# Patient Record
Sex: Male | Born: 1945 | Race: White | Hispanic: No | Smoking: Never smoker
Health system: Southern US, Community
[De-identification: ages and names within clinical notes are randomized; demographics above are authoritative.]

## PROBLEM LIST (undated history)

## (undated) DIAGNOSIS — R7303 Prediabetes: Secondary | ICD-10-CM

## (undated) DIAGNOSIS — I10 Essential (primary) hypertension: Secondary | ICD-10-CM

## (undated) DIAGNOSIS — M1990 Unspecified osteoarthritis, unspecified site: Secondary | ICD-10-CM

## (undated) DIAGNOSIS — Z87442 Personal history of urinary calculi: Secondary | ICD-10-CM

## (undated) HISTORY — PX: COLONOSCOPY: SHX174

## (undated) HISTORY — PX: CARPAL TUNNEL RELEASE: SHX101

## (undated) HISTORY — PX: JOINT REPLACEMENT: SHX530

## (undated) HISTORY — PX: BACK SURGERY: SHX140

## (undated) HISTORY — PX: HERNIA REPAIR: SHX51

## (undated) HISTORY — PX: WISDOM TOOTH EXTRACTION: SHX21

---

## 2015-11-02 DIAGNOSIS — Z23 Encounter for immunization: Secondary | ICD-10-CM | POA: Diagnosis not present

## 2015-11-02 DIAGNOSIS — Z0000 Encounter for general adult medical examination without abnormal findings: Secondary | ICD-10-CM | POA: Diagnosis not present

## 2015-11-02 DIAGNOSIS — D485 Neoplasm of uncertain behavior of skin: Secondary | ICD-10-CM | POA: Diagnosis not present

## 2015-11-02 DIAGNOSIS — Z6831 Body mass index (BMI) 31.0-31.9, adult: Secondary | ICD-10-CM | POA: Diagnosis not present

## 2015-11-02 DIAGNOSIS — M109 Gout, unspecified: Secondary | ICD-10-CM | POA: Diagnosis not present

## 2015-11-02 DIAGNOSIS — Z79899 Other long term (current) drug therapy: Secondary | ICD-10-CM | POA: Diagnosis not present

## 2015-11-02 DIAGNOSIS — E119 Type 2 diabetes mellitus without complications: Secondary | ICD-10-CM | POA: Diagnosis not present

## 2015-11-02 DIAGNOSIS — I10 Essential (primary) hypertension: Secondary | ICD-10-CM | POA: Diagnosis not present

## 2015-11-02 DIAGNOSIS — E785 Hyperlipidemia, unspecified: Secondary | ICD-10-CM | POA: Diagnosis not present

## 2015-11-02 DIAGNOSIS — Z125 Encounter for screening for malignant neoplasm of prostate: Secondary | ICD-10-CM | POA: Diagnosis not present

## 2015-11-02 DIAGNOSIS — E669 Obesity, unspecified: Secondary | ICD-10-CM | POA: Diagnosis not present

## 2015-11-02 DIAGNOSIS — M1990 Unspecified osteoarthritis, unspecified site: Secondary | ICD-10-CM | POA: Diagnosis not present

## 2015-12-29 DIAGNOSIS — E119 Type 2 diabetes mellitus without complications: Secondary | ICD-10-CM | POA: Diagnosis not present

## 2015-12-29 DIAGNOSIS — H2513 Age-related nuclear cataract, bilateral: Secondary | ICD-10-CM | POA: Diagnosis not present

## 2015-12-29 DIAGNOSIS — H524 Presbyopia: Secondary | ICD-10-CM | POA: Diagnosis not present

## 2016-02-26 DIAGNOSIS — L570 Actinic keratosis: Secondary | ICD-10-CM | POA: Diagnosis not present

## 2016-02-26 DIAGNOSIS — D485 Neoplasm of uncertain behavior of skin: Secondary | ICD-10-CM | POA: Diagnosis not present

## 2016-02-26 DIAGNOSIS — D225 Melanocytic nevi of trunk: Secondary | ICD-10-CM | POA: Diagnosis not present

## 2016-02-26 DIAGNOSIS — D2239 Melanocytic nevi of other parts of face: Secondary | ICD-10-CM | POA: Diagnosis not present

## 2016-03-21 DIAGNOSIS — C44612 Basal cell carcinoma of skin of right upper limb, including shoulder: Secondary | ICD-10-CM | POA: Diagnosis not present

## 2016-07-11 DIAGNOSIS — M109 Gout, unspecified: Secondary | ICD-10-CM | POA: Diagnosis not present

## 2016-07-11 DIAGNOSIS — Z23 Encounter for immunization: Secondary | ICD-10-CM | POA: Diagnosis not present

## 2016-07-11 DIAGNOSIS — I10 Essential (primary) hypertension: Secondary | ICD-10-CM | POA: Diagnosis not present

## 2016-07-11 DIAGNOSIS — Z79899 Other long term (current) drug therapy: Secondary | ICD-10-CM | POA: Diagnosis not present

## 2016-07-11 DIAGNOSIS — E6609 Other obesity due to excess calories: Secondary | ICD-10-CM | POA: Diagnosis not present

## 2016-07-11 DIAGNOSIS — Z6831 Body mass index (BMI) 31.0-31.9, adult: Secondary | ICD-10-CM | POA: Diagnosis not present

## 2016-07-11 DIAGNOSIS — E119 Type 2 diabetes mellitus without complications: Secondary | ICD-10-CM | POA: Diagnosis not present

## 2016-07-11 DIAGNOSIS — E782 Mixed hyperlipidemia: Secondary | ICD-10-CM | POA: Diagnosis not present

## 2016-07-11 DIAGNOSIS — Z0000 Encounter for general adult medical examination without abnormal findings: Secondary | ICD-10-CM | POA: Diagnosis not present

## 2016-08-22 DIAGNOSIS — R748 Abnormal levels of other serum enzymes: Secondary | ICD-10-CM | POA: Diagnosis not present

## 2016-08-22 DIAGNOSIS — Z6830 Body mass index (BMI) 30.0-30.9, adult: Secondary | ICD-10-CM | POA: Diagnosis not present

## 2016-08-22 DIAGNOSIS — E6609 Other obesity due to excess calories: Secondary | ICD-10-CM | POA: Diagnosis not present

## 2016-08-22 DIAGNOSIS — E782 Mixed hyperlipidemia: Secondary | ICD-10-CM | POA: Diagnosis not present

## 2016-08-22 DIAGNOSIS — E119 Type 2 diabetes mellitus without complications: Secondary | ICD-10-CM | POA: Diagnosis not present

## 2016-08-28 DIAGNOSIS — C44212 Basal cell carcinoma of skin of right ear and external auricular canal: Secondary | ICD-10-CM | POA: Diagnosis not present

## 2016-08-28 DIAGNOSIS — D225 Melanocytic nevi of trunk: Secondary | ICD-10-CM | POA: Diagnosis not present

## 2016-08-28 DIAGNOSIS — D2239 Melanocytic nevi of other parts of face: Secondary | ICD-10-CM | POA: Diagnosis not present

## 2016-08-28 DIAGNOSIS — L821 Other seborrheic keratosis: Secondary | ICD-10-CM | POA: Diagnosis not present

## 2016-08-28 DIAGNOSIS — C44519 Basal cell carcinoma of skin of other part of trunk: Secondary | ICD-10-CM | POA: Diagnosis not present

## 2016-08-28 DIAGNOSIS — L820 Inflamed seborrheic keratosis: Secondary | ICD-10-CM | POA: Diagnosis not present

## 2016-09-12 DIAGNOSIS — C44519 Basal cell carcinoma of skin of other part of trunk: Secondary | ICD-10-CM | POA: Diagnosis not present

## 2016-12-30 DIAGNOSIS — R7303 Prediabetes: Secondary | ICD-10-CM | POA: Diagnosis not present

## 2016-12-30 DIAGNOSIS — H2513 Age-related nuclear cataract, bilateral: Secondary | ICD-10-CM | POA: Diagnosis not present

## 2017-02-06 DIAGNOSIS — I10 Essential (primary) hypertension: Secondary | ICD-10-CM | POA: Diagnosis not present

## 2017-02-06 DIAGNOSIS — E782 Mixed hyperlipidemia: Secondary | ICD-10-CM | POA: Diagnosis not present

## 2017-02-06 DIAGNOSIS — R748 Abnormal levels of other serum enzymes: Secondary | ICD-10-CM | POA: Diagnosis not present

## 2017-02-06 DIAGNOSIS — Z79899 Other long term (current) drug therapy: Secondary | ICD-10-CM | POA: Diagnosis not present

## 2017-02-06 DIAGNOSIS — E119 Type 2 diabetes mellitus without complications: Secondary | ICD-10-CM | POA: Diagnosis not present

## 2017-02-06 DIAGNOSIS — E6609 Other obesity due to excess calories: Secondary | ICD-10-CM | POA: Diagnosis not present

## 2017-02-06 DIAGNOSIS — Z6831 Body mass index (BMI) 31.0-31.9, adult: Secondary | ICD-10-CM | POA: Diagnosis not present

## 2017-02-06 DIAGNOSIS — M150 Primary generalized (osteo)arthritis: Secondary | ICD-10-CM | POA: Diagnosis not present

## 2017-04-14 DIAGNOSIS — M7582 Other shoulder lesions, left shoulder: Secondary | ICD-10-CM | POA: Diagnosis not present

## 2017-05-02 DIAGNOSIS — D225 Melanocytic nevi of trunk: Secondary | ICD-10-CM | POA: Diagnosis not present

## 2017-05-02 DIAGNOSIS — L814 Other melanin hyperpigmentation: Secondary | ICD-10-CM | POA: Diagnosis not present

## 2017-05-02 DIAGNOSIS — D485 Neoplasm of uncertain behavior of skin: Secondary | ICD-10-CM | POA: Diagnosis not present

## 2017-05-02 DIAGNOSIS — D2239 Melanocytic nevi of other parts of face: Secondary | ICD-10-CM | POA: Diagnosis not present

## 2017-05-02 DIAGNOSIS — L570 Actinic keratosis: Secondary | ICD-10-CM | POA: Diagnosis not present

## 2017-10-30 DIAGNOSIS — D225 Melanocytic nevi of trunk: Secondary | ICD-10-CM | POA: Diagnosis not present

## 2017-10-30 DIAGNOSIS — L570 Actinic keratosis: Secondary | ICD-10-CM | POA: Diagnosis not present

## 2017-10-30 DIAGNOSIS — D485 Neoplasm of uncertain behavior of skin: Secondary | ICD-10-CM | POA: Diagnosis not present

## 2017-10-30 DIAGNOSIS — L814 Other melanin hyperpigmentation: Secondary | ICD-10-CM | POA: Diagnosis not present

## 2017-10-30 DIAGNOSIS — D2239 Melanocytic nevi of other parts of face: Secondary | ICD-10-CM | POA: Diagnosis not present

## 2017-10-30 DIAGNOSIS — L821 Other seborrheic keratosis: Secondary | ICD-10-CM | POA: Diagnosis not present

## 2017-11-18 DIAGNOSIS — R202 Paresthesia of skin: Secondary | ICD-10-CM | POA: Diagnosis not present

## 2017-11-18 DIAGNOSIS — E119 Type 2 diabetes mellitus without complications: Secondary | ICD-10-CM | POA: Diagnosis not present

## 2017-11-18 DIAGNOSIS — R5381 Other malaise: Secondary | ICD-10-CM | POA: Diagnosis not present

## 2017-11-18 DIAGNOSIS — R5383 Other fatigue: Secondary | ICD-10-CM | POA: Diagnosis not present

## 2017-11-18 DIAGNOSIS — R200 Anesthesia of skin: Secondary | ICD-10-CM | POA: Diagnosis not present

## 2017-11-18 DIAGNOSIS — I10 Essential (primary) hypertension: Secondary | ICD-10-CM | POA: Diagnosis not present

## 2017-11-24 DIAGNOSIS — G5601 Carpal tunnel syndrome, right upper limb: Secondary | ICD-10-CM | POA: Diagnosis not present

## 2017-11-26 DIAGNOSIS — G629 Polyneuropathy, unspecified: Secondary | ICD-10-CM | POA: Diagnosis not present

## 2017-12-01 DIAGNOSIS — E1142 Type 2 diabetes mellitus with diabetic polyneuropathy: Secondary | ICD-10-CM | POA: Diagnosis not present

## 2017-12-01 DIAGNOSIS — G5603 Carpal tunnel syndrome, bilateral upper limbs: Secondary | ICD-10-CM | POA: Diagnosis not present

## 2017-12-05 DIAGNOSIS — E119 Type 2 diabetes mellitus without complications: Secondary | ICD-10-CM | POA: Diagnosis not present

## 2017-12-05 DIAGNOSIS — G5601 Carpal tunnel syndrome, right upper limb: Secondary | ICD-10-CM | POA: Diagnosis not present

## 2017-12-05 DIAGNOSIS — Z01818 Encounter for other preprocedural examination: Secondary | ICD-10-CM | POA: Diagnosis not present

## 2017-12-05 DIAGNOSIS — M150 Primary generalized (osteo)arthritis: Secondary | ICD-10-CM | POA: Diagnosis not present

## 2017-12-05 DIAGNOSIS — M109 Gout, unspecified: Secondary | ICD-10-CM | POA: Diagnosis not present

## 2017-12-22 DIAGNOSIS — G5601 Carpal tunnel syndrome, right upper limb: Secondary | ICD-10-CM | POA: Diagnosis not present

## 2017-12-22 DIAGNOSIS — M19041 Primary osteoarthritis, right hand: Secondary | ICD-10-CM | POA: Diagnosis not present

## 2017-12-25 DIAGNOSIS — Z79899 Other long term (current) drug therapy: Secondary | ICD-10-CM | POA: Diagnosis not present

## 2017-12-25 DIAGNOSIS — M1990 Unspecified osteoarthritis, unspecified site: Secondary | ICD-10-CM | POA: Diagnosis not present

## 2017-12-25 DIAGNOSIS — M109 Gout, unspecified: Secondary | ICD-10-CM | POA: Diagnosis not present

## 2017-12-25 DIAGNOSIS — Z7984 Long term (current) use of oral hypoglycemic drugs: Secondary | ICD-10-CM | POA: Diagnosis not present

## 2017-12-25 DIAGNOSIS — N400 Enlarged prostate without lower urinary tract symptoms: Secondary | ICD-10-CM | POA: Diagnosis not present

## 2017-12-25 DIAGNOSIS — Z791 Long term (current) use of non-steroidal anti-inflammatories (NSAID): Secondary | ICD-10-CM | POA: Diagnosis not present

## 2017-12-25 DIAGNOSIS — I10 Essential (primary) hypertension: Secondary | ICD-10-CM | POA: Diagnosis not present

## 2017-12-25 DIAGNOSIS — E7800 Pure hypercholesterolemia, unspecified: Secondary | ICD-10-CM | POA: Diagnosis not present

## 2017-12-25 DIAGNOSIS — M19041 Primary osteoarthritis, right hand: Secondary | ICD-10-CM | POA: Diagnosis not present

## 2017-12-25 DIAGNOSIS — E1142 Type 2 diabetes mellitus with diabetic polyneuropathy: Secondary | ICD-10-CM | POA: Diagnosis not present

## 2017-12-25 DIAGNOSIS — G5601 Carpal tunnel syndrome, right upper limb: Secondary | ICD-10-CM | POA: Diagnosis not present

## 2017-12-25 DIAGNOSIS — Z7982 Long term (current) use of aspirin: Secondary | ICD-10-CM | POA: Diagnosis not present

## 2017-12-29 DIAGNOSIS — Z4789 Encounter for other orthopedic aftercare: Secondary | ICD-10-CM | POA: Diagnosis not present

## 2017-12-29 DIAGNOSIS — M25531 Pain in right wrist: Secondary | ICD-10-CM | POA: Diagnosis not present

## 2017-12-31 DIAGNOSIS — E119 Type 2 diabetes mellitus without complications: Secondary | ICD-10-CM | POA: Diagnosis not present

## 2017-12-31 DIAGNOSIS — H2513 Age-related nuclear cataract, bilateral: Secondary | ICD-10-CM | POA: Diagnosis not present

## 2018-01-20 DIAGNOSIS — M25531 Pain in right wrist: Secondary | ICD-10-CM | POA: Diagnosis not present

## 2018-01-20 DIAGNOSIS — Z4789 Encounter for other orthopedic aftercare: Secondary | ICD-10-CM | POA: Diagnosis not present

## 2018-01-21 DIAGNOSIS — G629 Polyneuropathy, unspecified: Secondary | ICD-10-CM | POA: Diagnosis not present

## 2018-01-21 DIAGNOSIS — E119 Type 2 diabetes mellitus without complications: Secondary | ICD-10-CM | POA: Diagnosis not present

## 2018-01-21 DIAGNOSIS — Z0000 Encounter for general adult medical examination without abnormal findings: Secondary | ICD-10-CM | POA: Diagnosis not present

## 2018-01-21 DIAGNOSIS — I10 Essential (primary) hypertension: Secondary | ICD-10-CM | POA: Diagnosis not present

## 2018-01-21 DIAGNOSIS — M109 Gout, unspecified: Secondary | ICD-10-CM | POA: Diagnosis not present

## 2018-02-06 DIAGNOSIS — E119 Type 2 diabetes mellitus without complications: Secondary | ICD-10-CM | POA: Diagnosis not present

## 2018-02-06 DIAGNOSIS — E782 Mixed hyperlipidemia: Secondary | ICD-10-CM | POA: Diagnosis not present

## 2018-02-06 DIAGNOSIS — Z125 Encounter for screening for malignant neoplasm of prostate: Secondary | ICD-10-CM | POA: Diagnosis not present

## 2018-02-20 DIAGNOSIS — Z8042 Family history of malignant neoplasm of prostate: Secondary | ICD-10-CM | POA: Diagnosis not present

## 2018-02-20 DIAGNOSIS — R972 Elevated prostate specific antigen [PSA]: Secondary | ICD-10-CM | POA: Diagnosis not present

## 2018-02-20 DIAGNOSIS — N401 Enlarged prostate with lower urinary tract symptoms: Secondary | ICD-10-CM | POA: Diagnosis not present

## 2018-03-03 DIAGNOSIS — D485 Neoplasm of uncertain behavior of skin: Secondary | ICD-10-CM | POA: Diagnosis not present

## 2018-03-19 DIAGNOSIS — R972 Elevated prostate specific antigen [PSA]: Secondary | ICD-10-CM | POA: Diagnosis not present

## 2018-03-19 DIAGNOSIS — R3129 Other microscopic hematuria: Secondary | ICD-10-CM | POA: Diagnosis not present

## 2018-03-21 DIAGNOSIS — R109 Unspecified abdominal pain: Secondary | ICD-10-CM | POA: Diagnosis not present

## 2018-03-21 DIAGNOSIS — N201 Calculus of ureter: Secondary | ICD-10-CM | POA: Diagnosis not present

## 2018-03-22 DIAGNOSIS — R109 Unspecified abdominal pain: Secondary | ICD-10-CM | POA: Diagnosis not present

## 2018-03-24 DIAGNOSIS — M25531 Pain in right wrist: Secondary | ICD-10-CM | POA: Diagnosis not present

## 2018-04-14 DIAGNOSIS — R3129 Other microscopic hematuria: Secondary | ICD-10-CM | POA: Diagnosis not present

## 2018-04-14 DIAGNOSIS — N401 Enlarged prostate with lower urinary tract symptoms: Secondary | ICD-10-CM | POA: Diagnosis not present

## 2018-04-14 DIAGNOSIS — R972 Elevated prostate specific antigen [PSA]: Secondary | ICD-10-CM | POA: Diagnosis not present

## 2018-04-15 DIAGNOSIS — R972 Elevated prostate specific antigen [PSA]: Secondary | ICD-10-CM | POA: Diagnosis not present

## 2018-04-15 DIAGNOSIS — N401 Enlarged prostate with lower urinary tract symptoms: Secondary | ICD-10-CM | POA: Diagnosis not present

## 2018-04-21 DIAGNOSIS — M5412 Radiculopathy, cervical region: Secondary | ICD-10-CM | POA: Diagnosis not present

## 2018-04-22 DIAGNOSIS — N401 Enlarged prostate with lower urinary tract symptoms: Secondary | ICD-10-CM | POA: Diagnosis not present

## 2018-04-22 DIAGNOSIS — R972 Elevated prostate specific antigen [PSA]: Secondary | ICD-10-CM | POA: Diagnosis not present

## 2018-05-07 DIAGNOSIS — M4803 Spinal stenosis, cervicothoracic region: Secondary | ICD-10-CM | POA: Diagnosis not present

## 2018-05-07 DIAGNOSIS — M5412 Radiculopathy, cervical region: Secondary | ICD-10-CM | POA: Diagnosis not present

## 2018-05-15 DIAGNOSIS — M50121 Cervical disc disorder at C4-C5 level with radiculopathy: Secondary | ICD-10-CM | POA: Diagnosis not present

## 2018-05-15 DIAGNOSIS — M50123 Cervical disc disorder at C6-C7 level with radiculopathy: Secondary | ICD-10-CM | POA: Diagnosis not present

## 2018-05-15 DIAGNOSIS — M50122 Cervical disc disorder at C5-C6 level with radiculopathy: Secondary | ICD-10-CM | POA: Diagnosis not present

## 2018-05-29 DIAGNOSIS — M50121 Cervical disc disorder at C4-C5 level with radiculopathy: Secondary | ICD-10-CM | POA: Diagnosis not present

## 2018-05-29 DIAGNOSIS — M5412 Radiculopathy, cervical region: Secondary | ICD-10-CM | POA: Diagnosis not present

## 2018-06-19 DIAGNOSIS — M50121 Cervical disc disorder at C4-C5 level with radiculopathy: Secondary | ICD-10-CM | POA: Diagnosis not present

## 2018-07-06 DIAGNOSIS — L570 Actinic keratosis: Secondary | ICD-10-CM | POA: Diagnosis not present

## 2018-07-06 DIAGNOSIS — D2239 Melanocytic nevi of other parts of face: Secondary | ICD-10-CM | POA: Diagnosis not present

## 2018-07-06 DIAGNOSIS — D225 Melanocytic nevi of trunk: Secondary | ICD-10-CM | POA: Diagnosis not present

## 2018-07-06 DIAGNOSIS — L814 Other melanin hyperpigmentation: Secondary | ICD-10-CM | POA: Diagnosis not present

## 2018-07-06 DIAGNOSIS — L821 Other seborrheic keratosis: Secondary | ICD-10-CM | POA: Diagnosis not present

## 2018-07-27 DIAGNOSIS — E782 Mixed hyperlipidemia: Secondary | ICD-10-CM | POA: Diagnosis not present

## 2018-07-27 DIAGNOSIS — Z79899 Other long term (current) drug therapy: Secondary | ICD-10-CM | POA: Diagnosis not present

## 2018-07-27 DIAGNOSIS — R972 Elevated prostate specific antigen [PSA]: Secondary | ICD-10-CM | POA: Diagnosis not present

## 2018-07-27 DIAGNOSIS — Z23 Encounter for immunization: Secondary | ICD-10-CM | POA: Diagnosis not present

## 2018-07-27 DIAGNOSIS — G629 Polyneuropathy, unspecified: Secondary | ICD-10-CM | POA: Diagnosis not present

## 2018-07-27 DIAGNOSIS — M109 Gout, unspecified: Secondary | ICD-10-CM | POA: Diagnosis not present

## 2018-07-27 DIAGNOSIS — E119 Type 2 diabetes mellitus without complications: Secondary | ICD-10-CM | POA: Diagnosis not present

## 2018-07-27 DIAGNOSIS — I10 Essential (primary) hypertension: Secondary | ICD-10-CM | POA: Diagnosis not present

## 2018-07-27 DIAGNOSIS — M5412 Radiculopathy, cervical region: Secondary | ICD-10-CM | POA: Diagnosis not present

## 2018-08-24 DIAGNOSIS — M542 Cervicalgia: Secondary | ICD-10-CM | POA: Diagnosis not present

## 2018-08-24 DIAGNOSIS — M4802 Spinal stenosis, cervical region: Secondary | ICD-10-CM | POA: Diagnosis not present

## 2018-08-24 DIAGNOSIS — M5000 Cervical disc disorder with myelopathy, unspecified cervical region: Secondary | ICD-10-CM | POA: Diagnosis not present

## 2018-08-24 DIAGNOSIS — M5020 Other cervical disc displacement, unspecified cervical region: Secondary | ICD-10-CM | POA: Diagnosis not present

## 2018-08-26 ENCOUNTER — Other Ambulatory Visit: Payer: Self-pay | Admitting: Neurosurgery

## 2018-08-26 DIAGNOSIS — Z8042 Family history of malignant neoplasm of prostate: Secondary | ICD-10-CM | POA: Diagnosis not present

## 2018-08-26 DIAGNOSIS — R972 Elevated prostate specific antigen [PSA]: Secondary | ICD-10-CM | POA: Diagnosis not present

## 2018-08-26 DIAGNOSIS — N401 Enlarged prostate with lower urinary tract symptoms: Secondary | ICD-10-CM | POA: Diagnosis not present

## 2018-09-08 NOTE — Pre-Procedure Instructions (Signed)
[facility]  09/08/2018      [place], [place], [place] [address] Phone: [phone] Fax: [phone]    Your procedure is scheduled on September 17, 2018.  Report to [facility] Admitting at 530 AM.  Call this number if you have problems the morning of surgery:  [phone]   Remember:  Do not eat or drink after midnight.    Take these medicines the morning of surgery with A SIP OF WATER  Allopurinol (zyloprim)  Follow your surgeon's instructions on when to hold/resume aspirin.  If no instructions were given call the office to determine how they would like to you take aspirin  7 days prior to surgery STOP taking any Aspirin (unless otherwise instructed by your surgeon), Aleve, Naproxen, Ibuprofen, Motrin, Advil, Goody's, BC's, all herbal medications, fish oil, and all vitamins  WHAT DO I DO ABOUT MY DIABETES MEDICATION?   Marland Kitchen Do not take oral diabetes medicines (pills) the morning of surgery-metformin (glucophage).  Reviewed and Endorsed by [facility] Patient Education Committee, August 2015  How to Manage Your Diabetes Before and After Surgery  Why is it important to control my blood sugar before and after surgery? . Improving blood sugar levels before and after surgery helps healing and can limit problems. . A way of improving blood sugar control is eating a healthy diet by: o  Eating less sugar and carbohydrates o  Increasing activity/exercise o  Talking with your doctor about reaching your blood sugar goals . High blood sugars (greater than 180 mg/dL) can raise your risk of infections and slow your recovery, so you will need to focus on controlling your diabetes during the weeks before surgery. . Make sure that the doctor who takes care of your diabetes knows about your planned surgery including the date and location.  How do I manage my blood sugar before surgery? . Check your blood  sugar at least 4 times a day, starting 2 days before surgery, to make sure that the level is not too high or low. o Check your blood sugar the morning of your surgery when you wake up and every 2 hours until you get to the Short Stay unit. . If your blood sugar is less than 70 mg/dL, you will need to treat for low blood sugar: o Do not take insulin. o Treat a low blood sugar (less than 70 mg/dL) with  cup of clear juice (cranberry or apple), 4 glucose tablets, OR glucose gel. Recheck blood sugar in 15 minutes after treatment (to make sure it is greater than 70 mg/dL). If your blood sugar is not greater than 70 mg/dL on recheck, call [phone] o  for further instructions. . Report your blood sugar to the short stay nurse when you get to Short Stay.  . If you are admitted to the hospital after surgery: o Your blood sugar will be checked by the staff and you will probably be given insulin after surgery (instead of oral diabetes medicines) to make sure you have good blood sugar levels. o The goal for blood sugar control after surgery is 80-180 mg/dL.   [facility]- Preparing For Surgery  Before surgery, you can play an important role. Because skin is not sterile, your skin needs to be as free of germs as possible. You can reduce the number of germs on your skin by washing with CHG (chlorahexidine gluconate)  Soap before surgery.  CHG is an antiseptic cleaner which kills germs and bonds with the skin to continue killing germs even after washing.    Oral Hygiene is also important to reduce your risk of infection.  Remember - BRUSH YOUR TEETH THE MORNING OF SURGERY WITH YOUR REGULAR TOOTHPASTE  Please do not use if you have an allergy to CHG or antibacterial soaps. If your skin becomes reddened/irritated stop using the CHG.  Do not shave (including legs and underarms) for at least 48 hours prior to first CHG shower. It is OK to shave your face.  Please follow these instructions  carefully.   1. Shower the NIGHT BEFORE SURGERY and the MORNING OF SURGERY with CHG.   2. If you chose to wash your hair, wash your hair first as usual with your normal shampoo.  3. After you shampoo, rinse your hair and body thoroughly to remove the shampoo.  4. Use CHG as you would any other liquid soap. You can apply CHG directly to the skin and wash gently with a scrungie or a clean washcloth.   5. Apply the CHG Soap to your body ONLY FROM THE NECK DOWN.  Do not use on open wounds or open sores. Avoid contact with your eyes, ears, mouth and genitals (private parts). Wash Face and genitals (private parts)  with your normal soap.  6. Wash thoroughly, paying special attention to the area where your surgery will be performed.  7. Thoroughly rinse your body with warm water from the neck down.  8. DO NOT shower/wash with your normal soap after using and rinsing off the CHG Soap.  9. Pat yourself dry with a CLEAN TOWEL.  10. Wear CLEAN PAJAMAS to bed the night before surgery, wear comfortable clothes the morning of surgery  11. Place CLEAN SHEETS on your bed the night of your first shower and DO NOT SLEEP WITH PETS.  Day of Surgery:  Do not apply any deodorants/lotions.  Please wear clean clothes to the hospital/surgery center.   Remember to brush your teeth WITH YOUR REGULAR TOOTHPASTE.   Do not wear jewelry,  Do not wear lotions, powders, or colognes, or deodorant.  Men may shave face and neck.  Do not bring valuables to the hospital.  [facility] is not responsible for any belongings or valuables.  Contacts, dentures or bridgework may not be worn into surgery.  Leave your suitcase in the car.  After surgery it may be brought to your room.  For patients admitted to the hospital, discharge time will be determined by your treatment team.  Patients discharged the day of surgery will not be allowed to drive home.   Please read over the following fact sheets that you were  given. Pain Booklet, Coughing and Deep Breathing, MRSA Information and Surgical Site Infection Prevention

## 2018-09-09 ENCOUNTER — Encounter (HOSPITAL_COMMUNITY): Payer: Self-pay

## 2018-09-09 ENCOUNTER — Other Ambulatory Visit: Payer: Self-pay

## 2018-09-09 ENCOUNTER — Encounter (HOSPITAL_COMMUNITY)
Admission: RE | Admit: 2018-09-09 | Discharge: 2018-09-09 | Disposition: A | Discharge status: Home / Self Care | Payer: PPO | Source: Ambulatory Visit | Attending: Neurosurgery | Admitting: Neurosurgery

## 2018-09-09 DIAGNOSIS — Z01812 Encounter for preprocedural laboratory examination: Secondary | ICD-10-CM | POA: Diagnosis not present

## 2018-09-09 HISTORY — DX: Unspecified osteoarthritis, unspecified site: M19.90

## 2018-09-09 HISTORY — DX: Prediabetes: R73.03

## 2018-09-09 HISTORY — DX: Essential (primary) hypertension: I10

## 2018-09-09 HISTORY — DX: Personal history of urinary calculi: Z87.442

## 2018-09-09 LAB — GLUCOSE, CAPILLARY: GLUCOSE-CAPILLARY: 96 mg/dL (ref 70–99)

## 2018-09-09 LAB — CBC
HCT: 47.6 % (ref 39.0–52.0)
Hemoglobin: 15.1 g/dL (ref 13.0–17.0)
MCH: 27.8 pg (ref 26.0–34.0)
MCHC: 31.7 g/dL (ref 30.0–36.0)
MCV: 87.7 fL (ref 80.0–100.0)
NRBC: 0 % (ref 0.0–0.2)
PLATELETS: 196 10*3/uL (ref 150–400)
RBC: 5.43 MIL/uL (ref 4.22–5.81)
RDW: 15 % (ref 11.5–15.5)
WBC: 5.6 10*3/uL (ref 4.0–10.5)

## 2018-09-09 LAB — ABO/RH: ABO/RH(D): O POS

## 2018-09-09 LAB — BASIC METABOLIC PANEL
ANION GAP: 6 (ref 5–15)
BUN: 13 mg/dL (ref 8–23)
CALCIUM: 8.7 mg/dL — AB (ref 8.9–10.3)
CO2: 25 mmol/L (ref 22–32)
Chloride: 108 mmol/L (ref 98–111)
Creatinine, Ser: 0.63 mg/dL (ref 0.61–1.24)
Glucose, Bld: 99 mg/dL (ref 70–99)
Potassium: 3.8 mmol/L (ref 3.5–5.1)
Sodium: 139 mmol/L (ref 135–145)

## 2018-09-09 LAB — TYPE AND SCREEN
ABO/RH(D): O POS
ANTIBODY SCREEN: NEGATIVE

## 2018-09-09 LAB — SURGICAL PCR SCREEN
MRSA, PCR: NEGATIVE
Staphylococcus aureus: NEGATIVE

## 2018-09-09 LAB — HEMOGLOBIN A1C
HEMOGLOBIN A1C: 6.3 % — AB (ref 4.8–5.6)
MEAN PLASMA GLUCOSE: 134.11 mg/dL

## 2018-09-09 NOTE — Progress Notes (Signed)
PCP - Dr. Bea Graff  Chest x-ray -  N/A EKG - 11/2017 Requested  Fasting Blood Sugar - 90s Checks Blood Sugar: doesn't check  Blood Thinner Instructions: N/A Aspirin Instructions: Stopped 11/11  Anesthesia review: yes, requested EKG  Patient denies shortness of breath, fever, cough and chest pain at PAT appointment   Patient verbalized understanding of instructions that were given to them at the PAT appointment. Patient was also instructed that they will need to review over the PAT instructions again at home before surgery.

## 2018-09-15 NOTE — H&P (Signed)
Patient ID:   [id number] Patient: Andre Mitchell  Date of Birth: Dec 25, 1945 Visit Type: Office Visit   Date: 08/24/2018 02:30 PM Provider: Marchia Meiers. Vertell Limber MD   This 72 year old male presents for neck pain and bilateral arm pain with numbness.  HISTORY OF PRESENT ILLNESS:  1.  neck pain  2.  bilateral arm pain with numbness  Claudean Severance, ((in [place] as [place] D [place]), brother of patient Zenovia Jordan, visits for evaluation.  Patient reports right arm and right hand numbness and tingling and weakness (all digits) increasing since his carpal tunnel release in February.  He notes he is dropping items from the right hand.  He also notes mild left hand numbness and tingling (1st, 2nd, and 3rd digits) and mild occasional neck pain.  ESI x2 offered only 3 days relief  Gabapentin 300 mg 1 per day Naproxen 500 mg b.i.d.  History:  HTN, gout, NIDDM Surgical history:  Lumbar surgery 1986, right inguinal hernia repair 1992, left hip replacement 2012, right carpal tunnel release February 2019  Cervical myelogram 05/07/2018  [shrapnel below the right eye]           MEDICATIONS: (added, continued or stopped this visit)   ALLERGIES: Ingredient Reaction Medication Name Comment  NO KNOWN ALLERGIES     No known allergies.   REVIEW OF SYSTEMS   See scanned patient registration form, dated, signed and dated on   Review of Systems Details System Neg/Pos Details  Constitutional Negative Chills, Fatigue, Fever, Malaise, Night sweats, Weight gain and Weight loss.  ENMT Negative Ear drainage, Hearing loss, Nasal drainage, Otalgia, Sinus pressure and Sore throat.  Eyes Negative Eye discharge, Eye pain and Vision changes.  Respiratory Negative Chronic cough, Cough, Dyspnea, Known TB exposure and Wheezing.  Cardio Negative Chest pain, Claudication, Edema and Irregular heartbeat/palpitations.  GI Negative Abdominal pain, Blood in stool, Change in stool pattern, Constipation, Decreased  appetite, Diarrhea, Heartburn, Nausea and Vomiting.  GU Negative Dribbling, Dysuria, Erectile dysfunction, Hematuria, Polyuria (Genitourinary), Slow stream, Urinary frequency, Urinary incontinence and Urinary retention.  Endocrine Negative Cold intolerance, Heat intolerance, Polydipsia and Polyphagia.  Neuro Positive Extremity weakness, Numbness in extremity.  Psych Negative Anxiety, Depression and Insomnia.  Integumentary Negative Brittle hair, Brittle nails, Change in shape/size of mole(s), Hair loss, Hirsutism, Hives, Pruritus, Rash and Skin lesion.  MS Negative Back pain, Joint pain, Joint swelling, Muscle weakness and Neck pain.  Hema/Lymph Negative Easy bleeding, Easy bruising and Lymphadenopathy.  Allergic/Immuno Negative Contact allergy, Environmental allergies, Food allergies and Seasonal allergies.  Reproductive Negative Penile discharge and Sexual dysfunction.   PHYSICAL EXAM:   Vitals Date Temp F BP Pulse Ht In Wt Lb BMI BSA Pain Score  08/24/2018  157/101 80 69 196 28.94  1/10    PHYSICAL EXAM Details General Level of Distress: no acute distress Overall Appearance: normal  Head and Face  Right Left  Fundoscopic Exam:  normal normal    Cardiovascular Cardiac: regular rate and rhythm without murmur  Right Left  Carotid Pulses: normal normal  Respiratory Lungs: clear to auscultation  Neurological Orientation: normal Recent and Remote Memory: normal Attention Span and Concentration:   normal Language: normal Fund of Knowledge: normal  Right Left Sensation: normal normal Upper Extremity Coordination: normal normal  Lower Extremity Coordination: normal normal  Musculoskeletal Gait and Station: normal  Right Left Upper Extremity Muscle Strength: normal normal Lower Extremity Muscle Strength: normal normal Upper Extremity Muscle Tone:  normal normal Lower Extremity Muscle Tone: normal normal  Motor Strength Upper and lower extremity motor strength was  tested in the clinically pertinent muscles. Any abnormal findings will be noted below.   Right Left Finger Extensor: 4-/5 4-/5   Deep Tendon Reflexes  Right Left Biceps: normal normal Triceps: normal normal Brachioradialis: normal normal Patellar: normal normal Achilles: normal normal  Cranial Nerves II. Optic Nerve/Visual Fields: normal III. Oculomotor: normal IV. Trochlear: normal V. Trigeminal: normal VI. Abducens: normal VII. Facial: normal VIII. Acoustic/Vestibular: normal IX. Glossopharyngeal: normal X. Vagus: normal XI. Spinal Accessory: normal XII. Hypoglossal: normal  Motor and other Tests Lhermittes: positive Rhomberg: negative Pronator drift: absent     Right Left Hoffman's: present present Clonus: normal normal Babinski: normal normal Tinels Wrist: negative negative   Additional Findings:  Upon examination, positive Lhermitte's to the right, 4-/5 bilateral hand intrinsics, suprapatellar reflexes increased, crossed abductors noted.   DIAGNOSTIC RESULTS:   05/07/18 CT C-spine with contrast Disc levels: C2-3: Facet arthropathy. Uncinate spurring. No impingement. C3-4: Advanced disc space narrowing. Central osteophyte formation with bilateral uncinated spurring, greater on the left. Bilateral facet arthropathy. Superimposed central and leftward disc extrusion, partially calcified. Moderate to severe stenosis and cord compression. Bilateral left greater than right C4 foraminal narrowing. C4-5: Disc space narrowing. Osseous spurring with superimposed soft disc extrusion, central and to the right, partially calcified. Bilateral C5 foraminal narrowing due to uncinated hypertrophy and facet arthropathy. Severe stenosis and cord compression. Bilateral right greater than left C5 foraminal narrowing. C5-6: Disc space narrowing. Osseous spurring with uncinated hypertrophy. Central and leftward extrusion. Moderate to severe stenosis and cord compression. Bilateral  left greater than right C6 foraminal narrowing. C6-7: Disc space narrowing. Annular bulge with osseous spurring.    IMPRESSION:   C-spine CT with contrast reveals C3-4 ruptured disc with spinal stenosis, spinal stenosis C4-5 (worst at this level), C5-6 spinal stenosis. Multilevel facet arthropathy, multilevel spondylosis worst at C4-5. Osseous spurring at C5 with significant cord compression. Surgery recommended - C3-4 ACDF  with C5 corpectomy and C3-6 anterior plate - advised patient that his symptoms may not improve after surgery due spinal cord damage - recommended patient undergo surgery sooner rather than later.  Upon examination, positive Lhermitte's to the right, 4-/5 bilateral hand intrinsics, 4-/5 bilateral finger  extensors, positive Hoffman's bilaterally, suprapatellar reflexes increased, crossed abductors noted, negative Tinel's bilaterally.  PLAN:  1. Nurse education given 2. Vista collar fitted 3. C3-4 ACDF with C5 corpectomy and C3-6 anterior plate 4. Follow-up after surgery  Orders: Diagnostic Procedures: Assessment Procedure  M50.00 Cervical Spine- Lateral  Miscellaneous: Assessment   M50.00 Vista Hard Collar Universal Se   Assessment/Plan   # Detail Type Description   1. 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       2. Assessment Herniated nucleus pulposus, cervical (M50.20).       3. 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   Plan Orders Vista Hard Collar Universal Se.                     Provider:  Marchia Meiers. Vertell Limber MD  08/24/2018 06:20 PM Dictation edited by: Mirian Mo    CC Providers: [facility] [place],  [place]  [id number]-   Greg Grisso   [facility] [place],  [id number]-               Electronically signed by Marchia Meiers. Vertell Limber MD on 08/29/2018 12:59 PM  Patient ID:  [id number] Patient: Jenaro Souder  Date of Birth: [birth year] Visit Type: Office Visit   Date: 08/24/2018 02:30 PM Provider: Marchia Meiers. Vertell Limber MD   This 72 year old male presents for neck pain and bilateral arm pain with numbness.  HISTORY OF PRESENT ILLNESS:  1.  neck pain  2.  bilateral arm pain with numbness  Claudean Severance, ((in [place] as [place] D [place]), brother of patient Zenovia Jordan, visits for evaluation.  Patient reports right arm and right hand numbness and tingling and weakness (all digits) increasing since his carpal tunnel release in February.  He notes he is dropping items from the right hand.  He also notes mild left hand numbness and tingling (1st, 2nd, and 3rd digits) and mild occasional neck pain.  ESI x2 offered only 3 days relief  Gabapentin 300 mg 1 per day Naproxen 500 mg b.i.d.  History:  HTN, gout, NIDDM Surgical history:  Lumbar surgery 1986, right inguinal hernia repair 1992, left hip replacement 2012, right carpal tunnel release February 2019  Cervical myelogram 05/07/2018  [shrapnel below the right eye]           MEDICATIONS: (added, continued or stopped this visit)   ALLERGIES: Ingredient Reaction Medication Name Comment  NO KNOWN ALLERGIES     No known allergies.   REVIEW OF SYSTEMS   See scanned patient registration form, dated, signed and dated on   Review of Systems Details System Neg/Pos Details  Constitutional Negative Chills, Fatigue, Fever, Malaise, Night sweats, Weight gain and Weight loss.  ENMT Negative Ear drainage, Hearing loss, Nasal drainage, Otalgia, Sinus pressure and Sore throat.  Eyes Negative Eye discharge, Eye pain and Vision changes.  Respiratory Negative Chronic cough, Cough, Dyspnea, Known TB exposure and Wheezing.  Cardio Negative Chest pain, Claudication, Edema and Irregular heartbeat/palpitations.  GI Negative Abdominal pain, Blood in stool, Change in stool pattern, Constipation, Decreased appetite, Diarrhea, Heartburn, Nausea and Vomiting.   GU Negative Dribbling, Dysuria, Erectile dysfunction, Hematuria, Polyuria (Genitourinary), Slow stream, Urinary frequency, Urinary incontinence and Urinary retention.  Endocrine Negative Cold intolerance, Heat intolerance, Polydipsia and Polyphagia.  Neuro Positive Extremity weakness, Numbness in extremity.  Psych Negative Anxiety, Depression and Insomnia.  Integumentary Negative Brittle hair, Brittle nails, Change in shape/size of mole(s), Hair loss, Hirsutism, Hives, Pruritus, Rash and Skin lesion.  MS Negative Back pain, Joint pain, Joint swelling, Muscle weakness and Neck pain.  Hema/Lymph Negative Easy bleeding, Easy bruising and Lymphadenopathy.  Allergic/Immuno Negative Contact allergy, Environmental allergies, Food allergies and Seasonal allergies.  Reproductive Negative Penile discharge and Sexual dysfunction.   PHYSICAL EXAM:   Vitals Date Temp F BP Pulse Ht In Wt Lb BMI BSA Pain Score  08/24/2018  157/101 80 69 196 28.94  1/10    PHYSICAL EXAM Details General Level of Distress: no acute distress Overall Appearance: normal  Head and Face  Right Left  Fundoscopic Exam:  normal normal    Cardiovascular Cardiac: regular rate and rhythm without murmur  Right Left  Carotid Pulses: normal normal  Respiratory Lungs: clear to auscultation  Neurological Orientation: normal Recent and Remote Memory: normal Attention Span and Concentration:   normal Language: normal Fund of Knowledge: normal  Right Left Sensation: normal normal Upper Extremity Coordination: normal normal  Lower Extremity Coordination: normal normal  Musculoskeletal Gait and Station: normal  Right Left Upper Extremity Muscle Strength: normal normal Lower Extremity Muscle Strength: normal normal Upper Extremity Muscle Tone:  normal normal Lower Extremity Muscle Tone: normal normal   Motor Strength  Upper and lower extremity motor strength was tested in the clinically pertinent muscles. Any  abnormal findings will be noted below.   Right Left Finger Extensor: 4-/5 4-/5   Deep Tendon Reflexes  Right Left Biceps: normal normal Triceps: normal normal Brachioradialis: normal normal Patellar: normal normal Achilles: normal normal  Cranial Nerves II. Optic Nerve/Visual Fields: normal III. Oculomotor: normal IV. Trochlear: normal V. Trigeminal: normal VI. Abducens: normal VII. Facial: normal VIII. Acoustic/Vestibular: normal IX. Glossopharyngeal: normal X. Vagus: normal XI. Spinal Accessory: normal XII. Hypoglossal: normal  Motor and other Tests Lhermittes: positive Rhomberg: negative Pronator drift: absent     Right Left Hoffman's: present present Clonus: normal normal Babinski: normal normal Tinels Wrist: negative negative   Additional Findings:  Upon examination, positive Lhermitte's to the right, 4-/5 bilateral hand intrinsics, suprapatellar reflexes increased, crossed abductors noted.   DIAGNOSTIC RESULTS:   05/07/18 CT C-spine with contrast Disc levels: C2-3: Facet arthropathy. Uncinate spurring. No impingement. C3-4: Advanced disc space narrowing. Central osteophyte formation with bilateral uncinated spurring, greater on the left. Bilateral facet arthropathy. Superimposed central and leftward disc extrusion, partially calcified. Moderate to severe stenosis and cord compression. Bilateral left greater than right C4 foraminal narrowing. C4-5: Disc space narrowing. Osseous spurring with superimposed soft disc extrusion, central and to the right, partially calcified. Bilateral C5 foraminal narrowing due to uncinated hypertrophy and facet arthropathy. Severe stenosis and cord compression. Bilateral right greater than left C5 foraminal narrowing. C5-6: Disc space narrowing. Osseous spurring with uncinated hypertrophy. Central and leftward extrusion. Moderate to severe stenosis and cord compression. Bilateral left greater than right C6 foraminal  narrowing. C6-7: Disc space narrowing. Annular bulge with osseous spurring.    IMPRESSION:   C-spine CT with contrast reveals C3-4 ruptured disc with spinal stenosis, spinal stenosis C4-5 (worst at this level), C5-6 spinal stenosis. Multilevel facet arthropathy, multilevel spondylosis worst at C4-5. Osseous spurring at C5 with significant cord compression. Surgery recommended - C3-4 ACDF  with C5 corpectomy and C3-6 anterior plate - advised patient that his symptoms may not improve after surgery due spinal cord damage - recommended patient undergo surgery sooner rather than later.  Upon examination, positive Lhermitte's to the right, 4-/5 bilateral hand intrinsics, 4-/5 bilateral finger  extensors, positive Hoffman's bilaterally, suprapatellar reflexes increased, crossed abductors noted, negative Tinel's bilaterally.  PLAN:  1. Nurse education given 2. Vista collar fitted 3. C3-4 ACDF with C5 corpectomy and C3-6 anterior plate 4. Follow-up after surgery  Orders: Diagnostic Procedures: Assessment Procedure  M50.00 Cervical Spine- Lateral  Miscellaneous: Assessment   M50.00 Vista Hard Collar Universal Se   Assessment/Plan   # Detail Type Description   1. 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       2. Assessment Herniated nucleus pulposus, cervical (M50.20).       3. 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   Plan Orders Vista Hard Collar Universal Se.                     Provider:  Marchia Meiers. Vertell Limber MD  08/24/2018 06:20 PM Dictation edited by: Mirian Mo    CC Providers: [facility] [place],  [place]  [id number]-   Greg Grisso  [facility] [place], [place] [id number]-               Electronically signed by Marchia Meiers. Vertell Limber MD on 08/29/2018 12:59 PM

## 2018-09-17 ENCOUNTER — Inpatient Hospital Stay (HOSPITAL_COMMUNITY): Payer: PPO

## 2018-09-17 ENCOUNTER — Encounter (HOSPITAL_COMMUNITY)
Admission: RE | Disposition: A | Discharge status: Home / Self Care | Payer: Self-pay | Source: Ambulatory Visit | Attending: Neurosurgery

## 2018-09-17 ENCOUNTER — Inpatient Hospital Stay (HOSPITAL_COMMUNITY): Payer: PPO | Admitting: Physician Assistant

## 2018-09-17 ENCOUNTER — Encounter (HOSPITAL_COMMUNITY): Payer: Self-pay | Admitting: Urology

## 2018-09-17 ENCOUNTER — Inpatient Hospital Stay (HOSPITAL_COMMUNITY)
Admission: RE | Admit: 2018-09-17 | Discharge: 2018-09-18 | DRG: 473 | Disposition: A | Discharge status: Home / Self Care | Payer: PPO | Source: Ambulatory Visit | Attending: Neurosurgery | Admitting: Neurosurgery

## 2018-09-17 ENCOUNTER — Inpatient Hospital Stay (HOSPITAL_COMMUNITY): Payer: PPO | Admitting: Certified Registered"

## 2018-09-17 DIAGNOSIS — Z7984 Long term (current) use of oral hypoglycemic drugs: Secondary | ICD-10-CM | POA: Diagnosis not present

## 2018-09-17 DIAGNOSIS — E119 Type 2 diabetes mellitus without complications: Secondary | ICD-10-CM | POA: Diagnosis present

## 2018-09-17 DIAGNOSIS — Z791 Long term (current) use of non-steroidal anti-inflammatories (NSAID): Secondary | ICD-10-CM | POA: Diagnosis not present

## 2018-09-17 DIAGNOSIS — Z7982 Long term (current) use of aspirin: Secondary | ICD-10-CM | POA: Diagnosis not present

## 2018-09-17 DIAGNOSIS — M4802 Spinal stenosis, cervical region: Secondary | ICD-10-CM | POA: Diagnosis not present

## 2018-09-17 DIAGNOSIS — Z96642 Presence of left artificial hip joint: Secondary | ICD-10-CM | POA: Diagnosis present

## 2018-09-17 DIAGNOSIS — M50021 Cervical disc disorder at C4-C5 level with myelopathy: Secondary | ICD-10-CM | POA: Diagnosis not present

## 2018-09-17 DIAGNOSIS — M5000 Cervical disc disorder with myelopathy, unspecified cervical region: Secondary | ICD-10-CM | POA: Diagnosis not present

## 2018-09-17 DIAGNOSIS — M4322 Fusion of spine, cervical region: Secondary | ICD-10-CM | POA: Diagnosis not present

## 2018-09-17 DIAGNOSIS — G959 Disease of spinal cord, unspecified: Secondary | ICD-10-CM | POA: Diagnosis present

## 2018-09-17 DIAGNOSIS — M50022 Cervical disc disorder at C5-C6 level with myelopathy: Secondary | ICD-10-CM | POA: Diagnosis not present

## 2018-09-17 DIAGNOSIS — Z419 Encounter for procedure for purposes other than remedying health state, unspecified: Secondary | ICD-10-CM

## 2018-09-17 DIAGNOSIS — I10 Essential (primary) hypertension: Secondary | ICD-10-CM | POA: Diagnosis not present

## 2018-09-17 DIAGNOSIS — M542 Cervicalgia: Secondary | ICD-10-CM | POA: Diagnosis present

## 2018-09-17 DIAGNOSIS — M5001 Cervical disc disorder with myelopathy,  high cervical region: Secondary | ICD-10-CM | POA: Diagnosis not present

## 2018-09-17 DIAGNOSIS — M109 Gout, unspecified: Secondary | ICD-10-CM | POA: Diagnosis not present

## 2018-09-17 HISTORY — PX: ANTERIOR CERVICAL CORPECTOMY: SHX1159

## 2018-09-17 LAB — GLUCOSE, CAPILLARY
GLUCOSE-CAPILLARY: 140 mg/dL — AB (ref 70–99)
GLUCOSE-CAPILLARY: 192 mg/dL — AB (ref 70–99)
GLUCOSE-CAPILLARY: 166 mg/dL — AB (ref 70–99)
Glucose-Capillary: 107 mg/dL — ABNORMAL HIGH (ref 70–99)

## 2018-09-17 SURGERY — ANTERIOR CERVICAL CORPECTOMY
Anesthesia: General | Site: Spine Cervical

## 2018-09-17 MED ORDER — SODIUM CHLORIDE 0.9% FLUSH: 3.0000 mL | INTRAVENOUS | Status: DC | PRN

## 2018-09-17 MED ORDER — SODIUM CHLORIDE 0.9 % IV SOLN: 250.0000 mL | INTRAVENOUS | Status: DC

## 2018-09-17 MED ORDER — DOCUSATE SODIUM 100 MG PO CAPS
100.0000 mg | ORAL_CAPSULE | Freq: Two times a day (BID) | ORAL | Status: DC
  Administered 2018-09-17 (×2): 100 mg via ORAL
  Filled 2018-09-17 (×2): qty 1

## 2018-09-17 MED ORDER — SODIUM CHLORIDE 0.9% FLUSH
3.0000 mL | Freq: Two times a day (BID) | INTRAVENOUS | Status: DC
  Administered 2018-09-17: 3 mL via INTRAVENOUS

## 2018-09-17 MED ORDER — PANTOPRAZOLE SODIUM 40 MG PO TBEC
40.0000 mg | DELAYED_RELEASE_TABLET | Freq: Every day | ORAL | Status: DC
  Administered 2018-09-17: 40 mg via ORAL

## 2018-09-17 MED ORDER — ACETAMINOPHEN 650 MG RE SUPP: 650.0000 mg | RECTAL | Status: DC | PRN

## 2018-09-17 MED ORDER — OXYCODONE HCL 5 MG PO TABS
ORAL_TABLET | ORAL | Status: AC
  Filled 2018-09-17: qty 1

## 2018-09-17 MED ORDER — CEFAZOLIN SODIUM-DEXTROSE 2-3 GM-%(50ML) IV SOLR
INTRAVENOUS | Status: DC | PRN
  Administered 2018-09-17: 2 g via INTRAVENOUS

## 2018-09-17 MED ORDER — OXYCODONE HCL 5 MG/5ML PO SOLN: 5.0000 mg | Freq: Once | ORAL | Status: AC | PRN

## 2018-09-17 MED ORDER — LACTATED RINGERS IV SOLN
INTRAVENOUS | Status: DC | PRN
  Administered 2018-09-17 (×2): via INTRAVENOUS

## 2018-09-17 MED ORDER — DEXAMETHASONE SODIUM PHOSPHATE 10 MG/ML IJ SOLN
INTRAMUSCULAR | Status: DC | PRN
  Administered 2018-09-17: 10 mg via INTRAVENOUS

## 2018-09-17 MED ORDER — THROMBIN 5000 UNITS EX SOLR
CUTANEOUS | Status: AC
  Filled 2018-09-17: qty 5000

## 2018-09-17 MED ORDER — SODIUM CHLORIDE 0.9 % IV SOLN
INTRAVENOUS | Status: DC | PRN
  Administered 2018-09-17: 25 ug/min via INTRAVENOUS

## 2018-09-17 MED ORDER — VECURONIUM BROMIDE 10 MG IV SOLR
INTRAVENOUS | Status: DC | PRN
  Administered 2018-09-17: 1 mg via INTRAVENOUS
  Administered 2018-09-17 (×2): 2 mg via INTRAVENOUS

## 2018-09-17 MED ORDER — PANTOPRAZOLE SODIUM 40 MG IV SOLR: 40.0000 mg | Freq: Every day | INTRAVENOUS | Status: DC

## 2018-09-17 MED ORDER — KCL IN DEXTROSE-NACL 20-5-0.45 MEQ/L-%-% IV SOLN: INTRAVENOUS | Status: DC

## 2018-09-17 MED ORDER — ONDANSETRON HCL 4 MG PO TABS: 4.0000 mg | ORAL_TABLET | Freq: Four times a day (QID) | ORAL | Status: DC | PRN

## 2018-09-17 MED ORDER — ATORVASTATIN CALCIUM 10 MG PO TABS
10.0000 mg | ORAL_TABLET | Freq: Every day | ORAL | Status: DC
  Administered 2018-09-17: 10 mg via ORAL
  Filled 2018-09-17: qty 1

## 2018-09-17 MED ORDER — ONDANSETRON HCL 4 MG/2ML IJ SOLN
INTRAMUSCULAR | Status: AC
  Filled 2018-09-17: qty 2

## 2018-09-17 MED ORDER — ZOLPIDEM TARTRATE 5 MG PO TABS: 5.0000 mg | ORAL_TABLET | Freq: Every evening | ORAL | Status: DC | PRN

## 2018-09-17 MED ORDER — SUGAMMADEX SODIUM 200 MG/2ML IV SOLN
INTRAVENOUS | Status: DC | PRN
  Administered 2018-09-17: 200 mg via INTRAVENOUS

## 2018-09-17 MED ORDER — CHLORHEXIDINE GLUCONATE CLOTH 2 % EX PADS: 6.0000 | MEDICATED_PAD | Freq: Once | CUTANEOUS | Status: DC

## 2018-09-17 MED ORDER — MIDAZOLAM HCL 2 MG/2ML IJ SOLN
INTRAMUSCULAR | Status: AC
  Filled 2018-09-17: qty 2

## 2018-09-17 MED ORDER — MORPHINE SULFATE (PF) 2 MG/ML IV SOLN: 2.0000 mg | INTRAVENOUS | Status: DC | PRN

## 2018-09-17 MED ORDER — POLYETHYLENE GLYCOL 3350 17 G PO PACK: 17.0000 g | PACK | Freq: Every day | ORAL | Status: DC | PRN

## 2018-09-17 MED ORDER — HYDROCODONE-ACETAMINOPHEN 7.5-325 MG PO TABS
2.0000 | ORAL_TABLET | ORAL | Status: DC | PRN
  Administered 2018-09-17 – 2018-09-18 (×5): 2 via ORAL
  Filled 2018-09-17 (×5): qty 2

## 2018-09-17 MED ORDER — ACETAMINOPHEN 325 MG PO TABS: 650.0000 mg | ORAL_TABLET | ORAL | Status: DC | PRN

## 2018-09-17 MED ORDER — METHOCARBAMOL 500 MG PO TABS
500.0000 mg | ORAL_TABLET | Freq: Four times a day (QID) | ORAL | Status: DC | PRN
  Administered 2018-09-17 – 2018-09-18 (×3): 500 mg via ORAL
  Filled 2018-09-17 (×3): qty 1

## 2018-09-17 MED ORDER — ADULT MULTIVITAMIN W/MINERALS CH: 1.0000 | ORAL_TABLET | Freq: Every day | ORAL | Status: DC

## 2018-09-17 MED ORDER — 0.9 % SODIUM CHLORIDE (POUR BTL) OPTIME
TOPICAL | Status: DC | PRN
  Administered 2018-09-17: 1000 mL

## 2018-09-17 MED ORDER — THROMBIN 5000 UNITS EX SOLR
OROMUCOSAL | Status: DC | PRN
  Administered 2018-09-17: 5 mL via TOPICAL
  Administered 2018-09-17: 09:00:00 via TOPICAL

## 2018-09-17 MED ORDER — INSULIN ASPART 100 UNIT/ML ~~LOC~~ SOLN: 0.0000 [IU] | Freq: Every day | SUBCUTANEOUS | Status: DC

## 2018-09-17 MED ORDER — METHOCARBAMOL 1000 MG/10ML IJ SOLN
500.0000 mg | Freq: Four times a day (QID) | INTRAVENOUS | Status: DC | PRN
  Filled 2018-09-17: qty 5

## 2018-09-17 MED ORDER — BUPIVACAINE HCL (PF) 0.5 % IJ SOLN
INTRAMUSCULAR | Status: AC
  Filled 2018-09-17: qty 30

## 2018-09-17 MED ORDER — CEFAZOLIN SODIUM-DEXTROSE 2-4 GM/100ML-% IV SOLN
2.0000 g | Freq: Three times a day (TID) | INTRAVENOUS | Status: AC
  Administered 2018-09-17 (×2): 2 g via INTRAVENOUS
  Filled 2018-09-17 (×2): qty 100

## 2018-09-17 MED ORDER — INSULIN ASPART 100 UNIT/ML ~~LOC~~ SOLN
0.0000 [IU] | Freq: Three times a day (TID) | SUBCUTANEOUS | Status: DC
  Administered 2018-09-17: 3 [IU] via SUBCUTANEOUS
  Administered 2018-09-18: 2 [IU] via SUBCUTANEOUS

## 2018-09-17 MED ORDER — OXYCODONE HCL 5 MG PO TABS
5.0000 mg | ORAL_TABLET | Freq: Once | ORAL | Status: AC | PRN
  Administered 2018-09-17: 5 mg via ORAL

## 2018-09-17 MED ORDER — ALUM & MAG HYDROXIDE-SIMETH 200-200-20 MG/5ML PO SUSP: 30.0000 mL | Freq: Four times a day (QID) | ORAL | Status: DC | PRN

## 2018-09-17 MED ORDER — PHENYLEPHRINE 40 MCG/ML (10ML) SYRINGE FOR IV PUSH (FOR BLOOD PRESSURE SUPPORT)
PREFILLED_SYRINGE | INTRAVENOUS | Status: AC
  Filled 2018-09-17: qty 10

## 2018-09-17 MED ORDER — LIDOCAINE-EPINEPHRINE 1 %-1:100000 IJ SOLN
INTRAMUSCULAR | Status: DC | PRN
  Administered 2018-09-17: 5 mL

## 2018-09-17 MED ORDER — LIDOCAINE 2% (20 MG/ML) 5 ML SYRINGE
INTRAMUSCULAR | Status: DC | PRN
  Administered 2018-09-17: 80 mg via INTRAVENOUS

## 2018-09-17 MED ORDER — PROMETHAZINE HCL 25 MG/ML IJ SOLN: 6.2500 mg | INTRAMUSCULAR | Status: DC | PRN

## 2018-09-17 MED ORDER — HYDROMORPHONE HCL 1 MG/ML IJ SOLN
INTRAMUSCULAR | Status: AC
  Filled 2018-09-17: qty 1

## 2018-09-17 MED ORDER — ROCURONIUM BROMIDE 50 MG/5ML IV SOSY
PREFILLED_SYRINGE | INTRAVENOUS | Status: DC | PRN
  Administered 2018-09-17: 50 mg via INTRAVENOUS

## 2018-09-17 MED ORDER — MENTHOL 3 MG MT LOZG: 1.0000 | LOZENGE | OROMUCOSAL | Status: DC | PRN

## 2018-09-17 MED ORDER — CEFAZOLIN SODIUM-DEXTROSE 2-4 GM/100ML-% IV SOLN
2.0000 g | INTRAVENOUS | Status: DC
  Filled 2018-09-17: qty 100

## 2018-09-17 MED ORDER — ONDANSETRON HCL 4 MG/2ML IJ SOLN: 4.0000 mg | Freq: Four times a day (QID) | INTRAMUSCULAR | Status: DC | PRN

## 2018-09-17 MED ORDER — VECURONIUM BROMIDE 10 MG IV SOLR
INTRAVENOUS | Status: AC
  Filled 2018-09-17: qty 10

## 2018-09-17 MED ORDER — FENTANYL CITRATE (PF) 100 MCG/2ML IJ SOLN
INTRAMUSCULAR | Status: DC | PRN
  Administered 2018-09-17: 50 ug via INTRAVENOUS
  Administered 2018-09-17: 100 ug via INTRAVENOUS
  Administered 2018-09-17 (×2): 50 ug via INTRAVENOUS

## 2018-09-17 MED ORDER — MIDAZOLAM HCL 5 MG/5ML IJ SOLN
INTRAMUSCULAR | Status: DC | PRN
  Administered 2018-09-17: 2 mg via INTRAVENOUS

## 2018-09-17 MED ORDER — GABAPENTIN 300 MG PO CAPS
600.0000 mg | ORAL_CAPSULE | Freq: Every evening | ORAL | Status: DC
  Administered 2018-09-17: 600 mg via ORAL
  Filled 2018-09-17: qty 2

## 2018-09-17 MED ORDER — DEXAMETHASONE SODIUM PHOSPHATE 10 MG/ML IJ SOLN
INTRAMUSCULAR | Status: AC
  Filled 2018-09-17: qty 1

## 2018-09-17 MED ORDER — SPIRONOLACTONE 25 MG PO TABS
25.0000 mg | ORAL_TABLET | Freq: Every day | ORAL | Status: DC
  Filled 2018-09-17 (×2): qty 1

## 2018-09-17 MED ORDER — BUPIVACAINE HCL (PF) 0.5 % IJ SOLN
INTRAMUSCULAR | Status: DC | PRN
  Administered 2018-09-17: 5 mL

## 2018-09-17 MED ORDER — PROPOFOL 10 MG/ML IV BOLUS
INTRAVENOUS | Status: AC
  Filled 2018-09-17: qty 20

## 2018-09-17 MED ORDER — INSULIN ASPART 100 UNIT/ML ~~LOC~~ SOLN
4.0000 [IU] | Freq: Three times a day (TID) | SUBCUTANEOUS | Status: DC
  Administered 2018-09-17 – 2018-09-18 (×2): 4 [IU] via SUBCUTANEOUS

## 2018-09-17 MED ORDER — LIDOCAINE-EPINEPHRINE 1 %-1:100000 IJ SOLN
INTRAMUSCULAR | Status: AC
  Filled 2018-09-17: qty 1

## 2018-09-17 MED ORDER — FENTANYL CITRATE (PF) 250 MCG/5ML IJ SOLN
INTRAMUSCULAR | Status: AC
  Filled 2018-09-17: qty 5

## 2018-09-17 MED ORDER — ONDANSETRON HCL 4 MG/2ML IJ SOLN
INTRAMUSCULAR | Status: DC | PRN
  Administered 2018-09-17: 4 mg via INTRAVENOUS

## 2018-09-17 MED ORDER — OXYCODONE HCL 5 MG PO TABS: 5.0000 mg | ORAL_TABLET | ORAL | Status: DC | PRN

## 2018-09-17 MED ORDER — PHENOL 1.4 % MT LIQD: 1.0000 | OROMUCOSAL | Status: DC | PRN

## 2018-09-17 MED ORDER — FLEET ENEMA 7-19 GM/118ML RE ENEM: 1.0000 | ENEMA | Freq: Once | RECTAL | Status: DC | PRN

## 2018-09-17 MED ORDER — HYDROMORPHONE HCL 1 MG/ML IJ SOLN
0.2500 mg | INTRAMUSCULAR | Status: DC | PRN
  Administered 2018-09-17: 0.5 mg via INTRAVENOUS

## 2018-09-17 MED ORDER — METFORMIN HCL ER 500 MG PO TB24
500.0000 mg | ORAL_TABLET | Freq: Every day | ORAL | Status: DC
  Administered 2018-09-18: 500 mg via ORAL
  Filled 2018-09-17: qty 1

## 2018-09-17 MED ORDER — BISACODYL 10 MG RE SUPP: 10.0000 mg | Freq: Every day | RECTAL | Status: DC | PRN

## 2018-09-17 MED ORDER — PROPOFOL 10 MG/ML IV BOLUS
INTRAVENOUS | Status: DC | PRN
  Administered 2018-09-17: 120 mg via INTRAVENOUS

## 2018-09-17 SURGICAL SUPPLY — 72 items
BASKET BONE COLLECTION (BASKET) ×3 IMPLANT
BIT DRILL NEURO 2X3.1 SFT TUCH (MISCELLANEOUS) ×1 IMPLANT
BIT DRILL POWER (BIT) ×1 IMPLANT
BLADE ULTRA TIP 2M (BLADE) ×3 IMPLANT
BNDG GAUZE ELAST 4 BULKY (GAUZE/BANDAGES/DRESSINGS) ×6 IMPLANT
BUR BARREL STRAIGHT FLUTE 4.0 (BURR) ×3 IMPLANT
CAGE CERVICAL 14X16X7 7D (Cage) ×2 IMPLANT
CAGE CERVICAL 14X16X7MM 7DEG (Cage) ×1 IMPLANT
CANISTER SUCT 3000ML PPV (MISCELLANEOUS) ×3 IMPLANT
CAP END MONOLITH PARA 14 RND (Cap) ×2 IMPLANT
CARTRIDGE OIL MAESTRO DRILL (MISCELLANEOUS) ×1 IMPLANT
CORE MONOLITH 012X20 (Miscellaneous) ×3 IMPLANT
COVER MAYO STAND STRL (DRAPES) ×3 IMPLANT
COVER WAND RF STERILE (DRAPES) ×3 IMPLANT
DECANTER SPIKE VIAL GLASS SM (MISCELLANEOUS) ×3 IMPLANT
DERMABOND ADVANCED (GAUZE/BANDAGES/DRESSINGS) ×2
DERMABOND ADVANCED .7 DNX12 (GAUZE/BANDAGES/DRESSINGS) ×1 IMPLANT
DIFFUSER DRILL AIR PNEUMATIC (MISCELLANEOUS) ×3 IMPLANT
DRAIN HEMOVAC 1/8 X 5 (WOUND CARE) ×3 IMPLANT
DRAPE HALF SHEET 40X57 (DRAPES) IMPLANT
DRAPE LAPAROTOMY 100X72 PEDS (DRAPES) ×3 IMPLANT
DRAPE MICROSCOPE LEICA (MISCELLANEOUS) ×3 IMPLANT
DRAPE POUCH INSTRU U-SHP 10X18 (DRAPES) ×3 IMPLANT
DRILL BIT POWER (BIT) ×2
DRILL NEURO 2X3.1 SOFT TOUCH (MISCELLANEOUS) ×3
DRSG OPSITE POSTOP 3X4 (GAUZE/BANDAGES/DRESSINGS) ×3 IMPLANT
DURAPREP 6ML APPLICATOR 50/CS (WOUND CARE) ×3 IMPLANT
ELECT COATED BLADE 2.86 ST (ELECTRODE) ×3 IMPLANT
ELECT REM PT RETURN 9FT ADLT (ELECTROSURGICAL) ×3
ELECTRODE REM PT RTRN 9FT ADLT (ELECTROSURGICAL) ×1 IMPLANT
ENDCAP MONOLITH PARA 14 RND (Cap) ×4 IMPLANT
EVACUATOR SILICONE 100CC (DRAIN) ×3 IMPLANT
GAUZE 4X4 16PLY RFD (DISPOSABLE) IMPLANT
GAUZE SPONGE 4X4 12PLY STRL (GAUZE/BANDAGES/DRESSINGS) IMPLANT
GLOVE BIO SURGEON STRL SZ8 (GLOVE) ×6 IMPLANT
GLOVE BIOGEL PI IND STRL 7.0 (GLOVE) ×1 IMPLANT
GLOVE BIOGEL PI IND STRL 7.5 (GLOVE) ×1 IMPLANT
GLOVE BIOGEL PI IND STRL 8 (GLOVE) ×1 IMPLANT
GLOVE BIOGEL PI IND STRL 8.5 (GLOVE) ×1 IMPLANT
GLOVE BIOGEL PI INDICATOR 7.0 (GLOVE) ×2
GLOVE BIOGEL PI INDICATOR 7.5 (GLOVE) ×2
GLOVE BIOGEL PI INDICATOR 8 (GLOVE) ×2
GLOVE BIOGEL PI INDICATOR 8.5 (GLOVE) ×2
GLOVE ECLIPSE 8.0 STRL XLNG CF (GLOVE) ×3 IMPLANT
GOWN STRL REUS W/ TWL LRG LVL3 (GOWN DISPOSABLE) ×2 IMPLANT
GOWN STRL REUS W/ TWL XL LVL3 (GOWN DISPOSABLE) ×2 IMPLANT
GOWN STRL REUS W/TWL 2XL LVL3 (GOWN DISPOSABLE) ×3 IMPLANT
GOWN STRL REUS W/TWL LRG LVL3 (GOWN DISPOSABLE) ×4
GOWN STRL REUS W/TWL XL LVL3 (GOWN DISPOSABLE) ×4
HALTER HD/CHIN CERV TRACTION D (MISCELLANEOUS) ×3 IMPLANT
HEMOSTAT POWDER KIT SURGIFOAM (HEMOSTASIS) ×6 IMPLANT
KIT BASIN OR (CUSTOM PROCEDURE TRAY) ×3 IMPLANT
KIT TURNOVER KIT B (KITS) ×3 IMPLANT
NEEDLE HYPO 25X1 1.5 SAFETY (NEEDLE) ×3 IMPLANT
NEEDLE SPNL 22GX3.5 QUINCKE BK (NEEDLE) ×3 IMPLANT
NS IRRIG 1000ML POUR BTL (IV SOLUTION) ×3 IMPLANT
OIL CARTRIDGE MAESTRO DRILL (MISCELLANEOUS) ×3
PACK LAMINECTOMY NEURO (CUSTOM PROCEDURE TRAY) ×3 IMPLANT
PAD ARMBOARD 7.5X6 YLW CONV (MISCELLANEOUS) ×9 IMPLANT
PIN DISTRACTION 14MM (PIN) ×6 IMPLANT
PLATE ARCHON 3-LEVEL 60MM (Plate) ×3 IMPLANT
RUBBERBAND STERILE (MISCELLANEOUS) ×6 IMPLANT
SCREW ARCHON SELFTAP 4.0X15MM (Screw) ×12 IMPLANT
SCREW ARCHON ST VAR 4.0X15MM (Screw) ×4 IMPLANT
SCREW BN 15X4XST VA NS SPN (Screw) ×2 IMPLANT
SPONGE INTESTINAL PEANUT (DISPOSABLE) ×3 IMPLANT
SPONGE SURGIFOAM ABS GEL SZ50 (HEMOSTASIS) IMPLANT
STAPLER SKIN PROX WIDE 3.9 (STAPLE) IMPLANT
SUT VIC AB 3-0 SH 8-18 (SUTURE) ×3 IMPLANT
TOWEL GREEN STERILE (TOWEL DISPOSABLE) ×3 IMPLANT
TOWEL GREEN STERILE FF (TOWEL DISPOSABLE) ×3 IMPLANT
WATER STERILE IRR 1000ML POUR (IV SOLUTION) ×3 IMPLANT

## 2018-09-17 NOTE — Anesthesia Procedure Notes (Signed)
Procedure Name: Intubation Date/Time: 09/17/2018 7:38 AM Performed by: Lavell Luster, CRNA Pre-anesthesia Checklist: Patient identified, Emergency Drugs available, Suction available, Patient being monitored and Timeout performed Patient Re-evaluated:Patient Re-evaluated prior to induction Oxygen Delivery Method: Circle system utilized Preoxygenation: Pre-oxygenation with 100% oxygen Induction Type: IV induction Ventilation: Mask ventilation without difficulty Laryngoscope Size: Mac, 4 and Glidescope Grade View: Grade I Tube type: Oral Tube size: 7.5 mm Number of attempts: 1 Airway Equipment and Method: Stylet Placement Confirmation: ETT inserted through vocal cords under direct vision,  positive ETCO2 and breath sounds checked- equal and bilateral Secured at: 22 cm Tube secured with: Tape Dental Injury: Teeth and Oropharynx as per pre-operative assessment

## 2018-09-17 NOTE — Interval H&P Note (Signed)
History and Physical Interval Note:  09/17/2018 7:31 AM  Andre Mitchell  has presented today for surgery, with the diagnosis of Cervical disc disorder with myelopathy  The various methods of treatment have been discussed with the patient and family. After consideration of risks, benefits and other options for treatment, the patient has consented to  Procedure(s) with comments: Cervical 3-4 Anterior cervical fusion with Cervical 5 corpectomy/plate Cervical 3 to cervical 6 (N/A) - Cervical 3-4 Anterior cervical fusion with Cervical 5 corpectomy/plate Cervical 3 to cervical 6 as a surgical intervention .  The patient's history has been reviewed, patient examined, no change in status, stable for surgery.  I have reviewed the patient's chart and labs.  Questions were answered to the patient's satisfaction.     Peggyann Shoals

## 2018-09-17 NOTE — Progress Notes (Signed)
Awake, alert, conversant.  Full strength bilateral D/B/T/HI.  MAEW.  Still some numbness in right arm, but improved.  Patient is doing well.

## 2018-09-17 NOTE — Op Note (Signed)
09/17/2018  10:40 AM  PATIENT:  Andre Mitchell  72 y.o. male  PRE-OPERATIVE DIAGNOSIS:  Cervical disc disorder with myelopathy  POST-OPERATIVE DIAGNOSIS:  Cervical disc disorder with myelopathy  PROCEDURE:  Procedure(s) with comments: Cervical Three-Four Anterior Cervical Fusion with Cervical Five Corpectomy and Plate Cervical Three to Cervical Six (N/A) - Cervical 3-4 Anterior cervical fusion with Cervical 5 corpectomy/plate Cervical 3 to cervical 6  SURGEON:  Surgeon(s) and Role:    Erline Levine, MD - Primary  PHYSICIAN ASSISTANT:   ASSISTANTS: Poteat, RN   ANESTHESIA:   general  EBL:  150 mL   BLOOD ADMINISTERED:none  DRAINS: (#10) Jackson-Pratt drain(s) with closed bulb suction in the prevertebral space   LOCAL MEDICATIONS USED:  MARCAINE    and LIDOCAINE   SPECIMEN:  No Specimen  DISPOSITION OF SPECIMEN:  N/A  COUNTS:  YES  TOURNIQUET:  * No tourniquets in log *  DICTATION: Patient was brought to operating room and following the smooth and uncomplicated induction of general endotracheal anesthesia his head was placed on a horseshoe head holder he was placed in 5 pounds of Holter traction and his anterior neck was prepped and draped in usual sterile fashion. An incision was made on the left side of midline after infiltrating the skin and subcutaneous tissues with local lidocaine. The platysmal layer was incised and subplatysmal dissection was performed exposing the anterior border sternocleidomastoid muscle. Using blunt dissection the carotid sheath was kept lateral and trachea and esophagus kept medial exposing the anterior cervical spine. A bent spinal needle was placed it was felt to be the C 34 levels and this was confirmed on intraoperative x-ray. Longus coli muscles were taken down from the anterior cervical spine using electrocautery and key elevator and self-retaining retractor was placed. The interspace at C 34 was incised and a thorough discectomy was performed.  Distraction pins were placed. Uncinate spurs and central spondylitic ridges were drilled down with a high-speed drill. The spinal cord dura and both C 4 nerve roots were widely decompressed. Hemostasis was assured. After trial sizing a 7 mm peek interbody cage was selected and packed with local autograft. The graft was tamped into position and countersunk appropriately. The retractor was moved and the interspace at C 45 and C 56 levels were incised and a corpectomy of C 5 was performed. Distraction pins were placed. Uncinate spurs and central spondylitic ridges were drilled down with a high-speed drill. The spinal cord dura and both C 5 and C 6  nerve roots were widely decompressed. Hemostasis was assured. A monolith PEEK modular cage with 14 mm endcaps, 12 mm core and 25 mm in lengths was assembled and packed with local autograft after trial sizingt. The graft was tamped into position and countersunk appropriately.  Distraction weight was removed. A 60 mm Nuvasive Archon anterior cervical plate was affixed to the cervical spine with 15 mm variable-angle screws 2 at C 3, and 15 mm fixed angle screws at C 4 and C 6. All screws were well-positioned and locking mechanisms were engaged. Soft tissues were inspected and found to be in good repair. The wound was irrigated. A final x-ray was obtained with good visualization of the entire construct with the interbody graft well visualized. A # 10 JP drain was inserted through a separate stab incision.  The platysma layer was closed with 3-0 Vicryl stitches and the skin was reapproximated with 3-0 Vicryl subcuticular stitches. The wound was dressed with Dermabond. Counts were correct at the end  of the case. Patient was extubated and taken to recovery in stable and satisfactory condition.    PLAN OF CARE: Admit to inpatient   PATIENT DISPOSITION:  PACU - hemodynamically stable.   Delay start of Pharmacological VTE agent (>24hrs) due to surgical blood loss or risk of  bleeding: yes

## 2018-09-17 NOTE — Anesthesia Preprocedure Evaluation (Addendum)
Anesthesia Evaluation  Patient identified by MRN, date of birth, ID band Patient awake    Reviewed: Allergy & Precautions, NPO status , Patient's Chart, lab work & pertinent test results  Airway Mallampati: II  TM Distance: >3 FB Neck ROM: Full    Dental no notable dental hx. (+) Dental Advisory Given, Partial Upper   Pulmonary neg pulmonary ROS,    Pulmonary exam normal breath sounds clear to auscultation       Cardiovascular hypertension, Pt. on medications negative cardio ROS Normal cardiovascular exam Rhythm:Regular Rate:Normal     Neuro/Psych negative neurological ROS  negative psych ROS   GI/Hepatic negative GI ROS, Neg liver ROS,   Endo/Other  diabetes, Type 2, Oral Hypoglycemic Agents  Renal/GU negative Renal ROS  negative genitourinary   Musculoskeletal  (+) Arthritis , Osteoarthritis,    Abdominal   Peds negative pediatric ROS (+)  Hematology negative hematology ROS (+)   Anesthesia Other Findings Partial plate given to Step Daughter.   Reproductive/Obstetrics negative OB ROS                            Anesthesia Physical Anesthesia Plan  ASA: III  Anesthesia Plan: General   Post-op Pain Management:    Induction: Intravenous  PONV Risk Score and Plan: 2 and Ondansetron, Midazolam and Treatment may vary due to age or medical condition  Airway Management Planned: Oral ETT  Additional Equipment:   Intra-op Plan:   Post-operative Plan: Extubation in OR  Informed Consent: I have reviewed the patients History and Physical, chart, labs and discussed the procedure including the risks, benefits and alternatives for the proposed anesthesia with the patient or authorized representative who has indicated his/her understanding and acceptance.   Dental advisory given  Plan Discussed with: CRNA  Anesthesia Plan Comments:         Anesthesia Quick Evaluation

## 2018-09-17 NOTE — Brief Op Note (Signed)
09/17/2018  10:40 AM  PATIENT:  Andre Mitchell  72 y.o. male  PRE-OPERATIVE DIAGNOSIS:  Cervical disc disorder with myelopathy  POST-OPERATIVE DIAGNOSIS:  Cervical disc disorder with myelopathy  PROCEDURE:  Procedure(s) with comments: Cervical Three-Four Anterior Cervical Fusion with Cervical Five Corpectomy and Plate Cervical Three to Cervical Six (N/A) - Cervical 3-4 Anterior cervical fusion with Cervical 5 corpectomy/plate Cervical 3 to cervical 6  SURGEON:  Surgeon(s) and Role:    Erline Levine, MD - Primary  PHYSICIAN ASSISTANT:   ASSISTANTS: Poteat, RN   ANESTHESIA:   general  EBL:  150 mL   BLOOD ADMINISTERED:none  DRAINS: (#10) Jackson-Pratt drain(s) with closed bulb suction in the prevertebral space   LOCAL MEDICATIONS USED:  MARCAINE    and LIDOCAINE   SPECIMEN:  No Specimen  DISPOSITION OF SPECIMEN:  N/A  COUNTS:  YES  TOURNIQUET:  * No tourniquets in log *  DICTATION: Patient was brought to operating room and following the smooth and uncomplicated induction of general endotracheal anesthesia his head was placed on a horseshoe head holder he was placed in 5 pounds of Holter traction and his anterior neck was prepped and draped in usual sterile fashion. An incision was made on the left side of midline after infiltrating the skin and subcutaneous tissues with local lidocaine. The platysmal layer was incised and subplatysmal dissection was performed exposing the anterior border sternocleidomastoid muscle. Using blunt dissection the carotid sheath was kept lateral and trachea and esophagus kept medial exposing the anterior cervical spine. A bent spinal needle was placed it was felt to be the C 34 levels and this was confirmed on intraoperative x-ray. Longus coli muscles were taken down from the anterior cervical spine using electrocautery and key elevator and self-retaining retractor was placed. The interspace at C 34 was incised and a thorough discectomy was performed.  Distraction pins were placed. Uncinate spurs and central spondylitic ridges were drilled down with a high-speed drill. The spinal cord dura and both C 4 nerve roots were widely decompressed. Hemostasis was assured. After trial sizing a 7 mm peek interbody cage was selected and packed with local autograft. The graft was tamped into position and countersunk appropriately. The retractor was moved and the interspace at C 45 and C 56 levels were incised and a corpectomy of C 5 was performed. Distraction pins were placed. Uncinate spurs and central spondylitic ridges were drilled down with a high-speed drill. The spinal cord dura and both C 5 and C 6  nerve roots were widely decompressed. Hemostasis was assured. A monolith PEEK modular cage with 14 mm endcaps, 12 mm core and 25 mm in lengths was assembled and packed with local autograft after trial sizingt. The graft was tamped into position and countersunk appropriately.  Distraction weight was removed. A 60 mm Nuvasive Archon anterior cervical plate was affixed to the cervical spine with 15 mm variable-angle screws 2 at C 3, and 15 mm fixed angle screws at C 4 and C 6. All screws were well-positioned and locking mechanisms were engaged. Soft tissues were inspected and found to be in good repair. The wound was irrigated. A final x-ray was obtained with good visualization of the entire construct with the interbody graft well visualized. A # 10 JP drain was inserted through a separate stab incision.  The platysma layer was closed with 3-0 Vicryl stitches and the skin was reapproximated with 3-0 Vicryl subcuticular stitches. The wound was dressed with Dermabond. Counts were correct at the end  of the case. Patient was extubated and taken to recovery in stable and satisfactory condition.    PLAN OF CARE: Admit to inpatient   PATIENT DISPOSITION:  PACU - hemodynamically stable.   Delay start of Pharmacological VTE agent (>24hrs) due to surgical blood loss or risk of  bleeding: yes

## 2018-09-17 NOTE — Transfer of Care (Signed)
Immediate Anesthesia Transfer of Care Note  Patient: Andre Mitchell  Procedure(s) Performed: Cervical Three-Four Anterior Cervical Fusion with Cervical Five Corpectomy and Plate Cervical Three to Cervical Six (N/A Spine Cervical)  Patient Location: PACU  Anesthesia Type:General  Level of Consciousness: awake, alert  and oriented  Airway & Oxygen Therapy: Patient connected to face mask oxygen  Post-op Assessment: Post -op Vital signs reviewed and stable  Post vital signs: stable  Last Vitals:  Vitals Value Taken Time  BP 169/104 09/17/2018 10:43 AM  Temp 36.2 C 09/17/2018 10:45 AM  Pulse 87 09/17/2018 10:46 AM  Resp 14 09/17/2018 10:46 AM  SpO2 96 % 09/17/2018 10:46 AM  Vitals shown include unvalidated device data.  Last Pain:  Vitals:   09/17/18 0614  PainSc: 0-No pain      Patients Stated Pain Goal: 4 (59/10/28 9022)  Complications: No apparent anesthesia complications

## 2018-09-17 NOTE — Anesthesia Postprocedure Evaluation (Signed)
Anesthesia Post Note  Patient: Andre Mitchell  Procedure(s) Performed: Cervical Three-Four Anterior Cervical Fusion with Cervical Five Corpectomy and Plate Cervical Three to Cervical Six (N/A Spine Cervical)     Patient location during evaluation: PACU Anesthesia Type: General Level of consciousness: awake and alert Pain management: pain level controlled Vital Signs Assessment: post-procedure vital signs reviewed and stable Respiratory status: spontaneous breathing, nonlabored ventilation and respiratory function stable Cardiovascular status: blood pressure returned to baseline and stable Postop Assessment: no apparent nausea or vomiting Anesthetic complications: no    Last Vitals:  Vitals:   09/17/18 1135 09/17/18 1215  BP: 125/88 (!) 132/94  Pulse: 87 90  Resp: 12 18  Temp:  36.6 C  SpO2: 95% 96%    Last Pain:  Vitals:   09/17/18 1215  TempSrc: Oral  PainSc:                  Lynda Rainwater

## 2018-09-18 LAB — GLUCOSE, CAPILLARY: Glucose-Capillary: 146 mg/dL — ABNORMAL HIGH (ref 70–99)

## 2018-09-18 MED ORDER — METHOCARBAMOL 500 MG PO TABS: 500.0000 mg | ORAL_TABLET | Freq: Four times a day (QID) | ORAL | 1 refills | Status: AC | PRN

## 2018-09-18 MED ORDER — HYDROCODONE-ACETAMINOPHEN 7.5-325 MG PO TABS: 1.0000 | ORAL_TABLET | ORAL | 0 refills | Status: AC | PRN

## 2018-09-18 NOTE — Progress Notes (Signed)
Patient given discharge instructions given to patient and sister. All questions answered. Patient discharged with Volunteer services. Lianne Bushy RN BSN.

## 2018-09-18 NOTE — Discharge Summary (Signed)
Physician Discharge Summary  Patient ID: Andre Mitchell MRN: [id number] DOB/AGE: Mar 08, 1946 72 y.o.  Admit date: 09/17/2018 Discharge date: 09/18/2018  Admission Diagnoses: Cervical disc disorder with myelopathy    Discharge Diagnoses: Cervical disc disorder with myelopathy s/p Cervical Three-Four Anterior Cervical Fusion with Cervical Five Corpectomy and Plate Cervical Three to Cervical Six (N/A) - Cervical 3-4 Anterior cervical fusion with Cervical 5 corpectomy/plate Cervical 3 to cervical 6     Active Problems:   Cervical myelopathy [facility])   Discharged Condition: good  Hospital Course: Andre Mitchell was admitted for surgery with cervical stenosis and myelopathy. Following uncomplicated surgery (above) he transferred to [facility] for nursing care and therapies. He is mobilizing well.   Consults: None  Significant Diagnostic Studies: radiology: X-Ray: intra-op  Treatments: surgery: Cervical Three-Four Anterior Cervical Fusion with Cervical Five Corpectomy and Plate Cervical Three to Cervical Six (N/A) - Cervical 3-4 Anterior cervical fusion with Cervical 5 corpectomy/plate Cervical 3 to cervical 6    Discharge Exam: Blood pressure 108/77, pulse 79, temperature 98 F (36.7 C), temperature source Oral, resp. rate 18, SpO2 100 %. Alert, conversant, sitting in chair. Vista collar in use. MAEW with improved strength BUE and hand intrinsics. He reports much improved symptoms left hand and significant improvement right. Drain only 90ml overnight, now pulled. Incision without erythema swelling or drainage. Dermabond intact. Mild posterior cervical and shoulder pain controlled by Norco and Robaxin.    Disposition:  Discharge to home. Rx's for Norco and Robaxin for home use. Pt and wife verbalize understanding of d/c instructions and agree to call office to schedule 3-4wk f/u visit.    Discharge Instructions    Diet - low sodium heart healthy   Complete by:  As  directed    Increase activity slowly   Complete by:  As directed      Allergies as of 09/18/2018   No Known Allergies     Medication List    STOP taking these medications   Glucosamine HCl 1000 MG Tabs   naproxen 500 MG tablet Commonly known as:  NAPROSYN     TAKE these medications   allopurinol 300 MG tablet Commonly known as:  ZYLOPRIM Take 150 mg by mouth daily.   aspirin EC 81 MG tablet Take 81 mg by mouth at bedtime.   atorvastatin 10 MG tablet Commonly known as:  LIPITOR Take 10 mg by mouth at bedtime.   Fish Oil 500 MG Caps Take 500 mg by mouth 2 (two) times daily.   gabapentin 300 MG capsule Commonly known as:  NEURONTIN Take 600 mg by mouth every evening.   HYDROcodone-acetaminophen 7.5-325 MG tablet Commonly known as:  NORCO Take 1-2 tablets by mouth every 4 (four) hours as needed for moderate pain or severe pain ((score 7 to 10)).   metFORMIN 500 MG 24 hr tablet Commonly known as:  GLUCOPHAGE-XR Take 500 mg by mouth daily with breakfast.   methocarbamol 500 MG tablet Commonly known as:  ROBAXIN Take 1 tablet (500 mg total) by mouth every 6 (six) hours as needed for muscle spasms.   multivitamin with minerals Tabs tablet Take 1 tablet by mouth daily.   Saw Palmetto 450 MG Caps Take 900 mg by mouth 2 (two) times daily.   spironolactone 25 MG tablet Commonly known as:  ALDACTONE Take 25 mg by mouth daily.        Signed: Peggyann Shoals, MD 09/18/2018, 7:45 AM

## 2018-09-18 NOTE — Evaluation (Signed)
Physical Therapy Evaluation Patient Details Name: Andre Mitchell MRN: [id number] DOB: 01/26/1946 Today's Date: 09/18/2018   History of Present Illness  Pt is a 72 y.o. male admitted for C3-4 fusion with C5 corpectomy and plate C3-6. PMH includes HTN, DM, previous lumbar fusion (86'), carpal tunnel release 10/2017 and R arm/hand weakness and N/t, L THA  Clinical Impression  Pt admitted with above diagnosis. Pt currently presents with decreased LE strength L>R, decreased dynamic balance and decreased awareness of precautions and deficits.This is impairing his ability to amb and ascend/descend stairs independently and safely at home. PT educated on car transfers, walking program, importance of using cane for amb at home and maintaining precautions during mobility and transfers. Pt will benefit from skilled PT to increase their independence and safety with mobility to allow discharge to the venue listed below.  PT will follow.     Follow Up Recommendations No PT follow up    Equipment Recommendations  Cane    Recommendations for Other Services       Precautions / Restrictions Precautions Precautions: Cervical;Fall Required Braces or Orthoses: Cervical Brace Cervical Brace: Hard collar Restrictions Weight Bearing Restrictions: No      Mobility  Bed Mobility               General bed mobility comments: OOB in recliner upon arrival   Transfers Overall transfer level: Needs assistance Equipment used: None Transfers: Sit to/from Stand Sit to Stand: Min guard         General transfer comment: VC to maintain precautions, push up from chair; minguard for safety  Ambulation/Gait Ambulation/Gait assistance: Min assist Gait Distance (Feet): 200 Feet Assistive device: Straight cane Gait Pattern/deviations: Step-to pattern;Decreased stride length;Trunk flexed;Decreased stance time - left Gait velocity: decreased from baseline per pt report Gait velocity interpretation: <1.8  ft/sec, indicate of risk for recurrent falls General Gait Details: Min assist for 3 mild LOB and when fatigues at end of amb. VC to stand upright, put weight through cane, heel strike. Able to heel strike with cues but not maintain. Drop foot on L.   Stairs Stairs: Yes Stairs assistance: Min guard;Min assist Stair Management: Step to pattern;One rail Left Number of Stairs: 4 General stair comments: Up with right, down with left, VC to use cane instead of hold it. Min assist to prevent LOB and forward lean when turning to descend stairs  Wheelchair Mobility    Modified Rankin (Stroke Patients Only)       Balance Overall balance assessment: Needs assistance Sitting-balance support: No upper extremity supported;Feet supported Sitting balance-Leahy Scale: Good     Standing balance support: Single extremity supported Standing balance-Leahy Scale: Fair Standing balance comment: pt often seeking out at least single UE support during room level mobility                              Pertinent Vitals/Pain Pain Assessment: Faces Faces Pain Scale: Hurts a little bit Pain Location: surgical pain Pain Descriptors / Indicators: Sore;Discomfort Pain Intervention(s): Limited activity within patient's tolerance;Monitored during session    [place] expects to be discharged to:: Private residence Living Arrangements: Alone Available Help at Discharge: Family(sister) Type of Home: House Home Access: Stairs to enter Entrance Stairs-Rails: Right Entrance Stairs-Number of Steps: 3 Home Layout: One level Home Equipment: Cane - single point;Cane - quad;Shower seat Additional Comments: pt reports using towel rack for grab bar   Prior Function Level of Independence:  Needs assistance   Gait / Transfers Assistance Needed: no AD  ADL's / Homemaking Assistance Needed: could not use right hand for fine motor tasks including holding utensils.       Hand Dominance    Dominant Hand: Right    Extremity/Trunk Assessment   Upper Extremity Assessment Upper Extremity Assessment: RUE deficits/detail RUE Deficits / Details: Can't oppose pinky to thumb. 4/5 R 5/5 L hand intrinsics. Pt believes weakness due to numbness. Pt states better than before surgery.  RUE Coordination: decreased fine motor     Lower Extremity Assessment Lower Extremity Assessment: Generalized weakness LLE Deficits / Details: drop foot    Communication   Communication: No difficulties  Cognition Arousal/Alertness: Awake/alert Behavior During Therapy: WFL for tasks assessed/performed Overall Cognitive Status: Within Functional Limits for tasks assessed                                        General Comments General comments (skin integrity, edema, etc.): sister present during session    Exercises    Assessment/Plan    PT Assessment Patient needs continued PT services  PT Problem List Decreased strength;Decreased mobility;Decreased safety awareness;Decreased coordination;Decreased activity tolerance;Decreased balance;Decreased knowledge of use of DME       PT Treatment Interventions DME instruction;Therapeutic activities;Cognitive remediation;Therapeutic exercise;Gait training;Patient/family education;Stair training;Balance training;Functional mobility training;Neuromuscular re-education    PT Goals (Current goals can be found in the Care Plan section)  Acute Rehab PT Goals Patient Stated Goal: Return home PT Goal Formulation: With patient/family Time For Goal Achievement: 10/02/18 Potential to Achieve Goals: Good    Frequency Min 5X/week   Barriers to discharge        Co-evaluation               AM-PAC PT "6 Clicks" Daily Activity  Outcome Measure Difficulty turning over in bed (including adjusting bedclothes, sheets and blankets)?: None Difficulty moving from lying on back to sitting on the side of the bed? : None Difficulty sitting  down on and standing up from a chair with arms (e.g., wheelchair, bedside commode, etc,.)?: None Help needed moving to and from a bed to chair (including a wheelchair)?: None Help needed walking in hospital room?: A Little Help needed climbing 3-5 steps with a railing? : A Little 6 Click Score: 22    End of Session Equipment Utilized During Treatment: Gait belt Activity Tolerance: Patient tolerated treatment well;Patient limited by fatigue Patient left: in chair;with family/visitor present;with call bell/phone within reach Nurse Communication: Mobility status PT Visit Diagnosis: Unsteadiness on feet (R26.81);Muscle weakness (generalized) (M62.81)    Time: 9480-1655 PT Time Calculation (min) (ACUTE ONLY): 21 min   Charges:   PT Evaluation $PT Eval Moderate Complexity: 1 Mod          Gilda Crease, SPT Acute Rehab Services [place] 09/18/2018, 10:13 AM

## 2018-09-18 NOTE — Evaluation (Addendum)
Occupational Therapy Evaluation Patient Details Name: Andre Mitchell MRN: [id number] DOB: [birth year] Today's Date: 09/18/2018    History of Present Illness Pt is a 72 y.o. male admitted for C3-4 fusion with C5 corpectomy and plate C3-6. PMH includes HTN, DM, previous lumbar fusion (86'), CT release 10/2017 and R arm/hand weakness and N/t, L THA   Clinical Impression   This 72 y/o male presents with the above. At baseline pt reports he is overall independent with ADLs and functional mobility, though reports prior to surgery was having significant difficulty using RUE for ADL performance. Pt demonstrating room level functional mobility with close minguard-light minA without AD. Pt reports has a SPC for use at home for increased stability during mobility completion. Pt currently requires supervision for UB ADL (minA for higher level fine motor tasks, I.e. Buttons); minguard-minA for LB ADL. Pt continues to demonstrate decreased fine motor/coordination in RUE, though reports improvements s/p surgery and demonstrates ability to hold and use ADL items this session. Pt will return home with family who are able to assist with ADLs PRN. Educated pt/pt's sister re: brace management, safety and compensatory strategies for performing ADLs and functional transfers while maintaining precautions with pt/family verbalizing understanding. Questions answered throughout. Do not anticipate pt will require follow up OT services at this time. Pt eager for d/c home today.     Follow Up Recommendations  No OT follow up;Supervision/Assistance - 24 hour    Equipment Recommendations  None recommended by OT(pt's DME needs are met)           Precautions / Restrictions Precautions Precautions: Cervical;Fall Precaution Booklet Issued: Yes (comment) Precaution Comments: verbally reviewed with pt/pt's sister Required Braces or Orthoses: Cervical Brace Cervical Brace: Hard collar Restrictions Weight Bearing Restrictions:  No      Mobility Bed Mobility               General bed mobility comments: OOB in recliner upon arrival   Transfers Overall transfer level: Needs assistance Equipment used: None Transfers: Sit to/from Stand Sit to Stand: Min guard         General transfer comment: VC to maintain precautions, push up from chair; minguard for safety    Balance Overall balance assessment: Needs assistance Sitting-balance support: No upper extremity supported;Feet supported Sitting balance-Leahy Scale: Good     Standing balance support: Single extremity supported Standing balance-Leahy Scale: Fair Standing balance comment: pt often seeking out at least single UE support during room level mobility                            ADL either performed or assessed with clinical judgement   ADL Overall ADL's : Needs assistance/impaired Eating/Feeding: Set up;Sitting Eating/Feeding Details (indicate cue type and reason): pt able to drink from cup using R hand given increased effort Grooming: Set up;Sitting   Upper Body Bathing: Min guard;Sitting   Lower Body Bathing: Min guard;Sit to/from stand Lower Body Bathing Details (indicate cue type and reason): educated on performing LB bathing from seated position to increase ease of reaching LEs while maintaining precautions and for increased safety Upper Body Dressing : Sitting;Min guard;Minimal assistance;With caregiver independent assisting Upper Body Dressing Details (indicate cue type and reason): pt's sister assisting with buttoning shirt upon arrival to room Lower Body Dressing: Minimal assistance;Sit to/from stand Lower Body Dressing Details (indicate cue type and reason): pt dressed upon entry; pt demonstrates figure 4 technique to don slippers with setup assist; minguard-minA  standing balance Toilet Transfer: Min guard;Minimal assistance;Ambulation;Comfort height toilet Toilet Transfer Details (indicate cue type and reason):  simulated in transfer to/from recliner Toileting- Water quality scientist and Hygiene: Min guard;Sit to/from Nurse, children's Details (indicate cue type and reason): educated on safe tub transfer techniques and to limit use of towel bar (as pt reports he typically uses this for transfer); educated to have someone with him initially during transfers, recommend pt sponge bathe initially until he can safely step into tub, pt verbalizing understanding/agreement Functional mobility during ADLs: Min guard;Minimal assistance General ADL Comments: ambulating in room without cane, though pt reports he has one at home for mobility use; educated pt/pt's sister re: brace management, safety and compensatory strategies for performing ADLs and functional transfers      Vision         Perception     Praxis      Pertinent Vitals/Pain Pain Assessment: Faces Faces Pain Scale: Hurts a little bit Pain Location: surgical pain Pain Descriptors / Indicators: Sore;Discomfort Pain Intervention(s): Limited activity within patient's tolerance;Monitored during session     Hand Dominance Right   Extremity/Trunk Assessment Upper Extremity Assessment Upper Extremity Assessment: RUE deficits/detail RUE Deficits / Details: pt reports numbness in R thumb, first and second digit; decreased fine motor, opposition and coordination. Pt reports improvements after surgery and increased ability to use hand for ADL RUE Sensation: decreased light touch RUE Coordination: decreased fine motor;decreased gross motor   Lower Extremity Assessment Lower Extremity Assessment: Defer to PT evaluation   Cervical / Trunk Assessment Cervical / Trunk Assessment: Other exceptions;Kyphotic Cervical / Trunk Exceptions: s/p cervical sx   Communication Communication Communication: No difficulties   Cognition Arousal/Alertness: Awake/alert Behavior During Therapy: WFL for tasks assessed/performed Overall Cognitive Status:  Within Functional Limits for tasks assessed                                     General Comments  sister present during session    Exercises Exercises: Other exercises Other Exercises Other Exercises: educased pt on general fine motor/coordination exercises and activities for continued improvements in RUE [facility] including finger opposition, squeeze ball, picking up and placing small items in cup, practicing opening various containers, etc.    Shoulder Instructions      Home Living Family/patient expects to be discharged to:: Private residence Living Arrangements: Alone Available Help at Discharge: Family(sister) Type of Home: House Home Access: Stairs to enter [employer] of Steps: 3 Entrance Stairs-Rails: Right Home Layout: One level     Bathroom Shower/Tub: Teacher, early years/pre: Handicapped height     Home Equipment: [place] - single point;Cane - quad;Shower seat   Additional Comments: towel rack used successfully for grab bar      Prior Functioning/Environment Level of Independence: Needs assistance  Gait / Transfers Assistance Needed: no AD ADL's / Homemaking Assistance Needed: could not use right hand for ADLs            OT Problem List: Decreased strength;Decreased range of motion;Decreased activity tolerance;Decreased knowledge of precautions      OT Treatment/Interventions:      OT Goals(Current goals can be found in the care plan section) Acute Rehab OT Goals Patient Stated Goal: Return home OT Goal Formulation: All assessment and education complete, DC therapy  OT Frequency:     Barriers to D/C:  Co-evaluation              AM-PAC PT "6 Clicks" Daily Activity     Outcome Measure Help from another person eating meals?: A Little Help from another person taking care of personal grooming?: A Little Help from another person toileting, which includes using toliet, bedpan, or urinal?: A Little Help  from another person bathing (including washing, rinsing, drying)?: A Little Help from another person to put on and taking off regular upper body clothing?: A Little Help from another person to put on and taking off regular lower body clothing?: A Little 6 Click Score: 18   End of Session Equipment Utilized During Treatment: Cervical collar Nurse Communication: Mobility status  Activity Tolerance: Patient tolerated treatment well Patient left: in chair;with call bell/phone within reach;with family/visitor present  OT Visit Diagnosis: Other abnormalities of gait and mobility (R26.89);Unsteadiness on feet (R26.81)                Time: 3085-6943 OT Time Calculation (min): 17 min Charges:  OT General Charges $OT Visit: 1 Visit OT Evaluation $OT Eval Low Complexity: [place], OT Supplemental Rehabilitation Services Pager [phone] Office [phone]   Raymondo Band 09/18/2018, 9:51 AM

## 2018-09-18 NOTE — Progress Notes (Addendum)
Subjective: Patient reports "I still have some numbness in these three fingers (right hand thumb, index and middle), but the others are a lot better"  Objective: Vital signs in last 24 hours: Temp:  [97.2 F (36.2 C)-98 F (36.7 C)] 98 F (36.7 C) (11/22 0333) Pulse Rate:  [79-94] 79 (11/22 0333) Resp:  [11-20] 18 (11/22 0333) BP: (108-169)/(68-104) 108/77 (11/22 0333) SpO2:  [95 %-100 %] 100 % (11/22 0333)  Intake/Output from previous day: 11/21 0701 - 11/22 0700 In: 2951 [P.O.:240; I.V.:1000; IV Piggyback:100] Out: 190 [Drains:40; Blood:150] Intake/Output this shift: No intake/output data recorded.  Alert, conversant, sitting in chair. [place] collar in use. MAEW with improved strength BUE and hand intrinsics. He reports much improved symptoms left hand and significant improvement right. Drain only 70ml overnight, now pulled. Incision without erythema swelling or drainage. Dermabond intact. Mild posterior cervical and shoulder pain controlled by Norco and Robaxin.  Lab Results: No results for input(s): WBC, HGB, HCT, PLT in the last 72 hours. BMET No results for input(s): NA, K, CL, CO2, GLUCOSE, BUN, CREATININE, CALCIUM in the last 72 hours.  Studies/Results: Dg Cervical Spine 2-3 Views  Result Date: 09/17/2018 CLINICAL DATA:  Neck pain. EXAM: CERVICAL SPINE - 2-3 VIEW; DG C-ARM 61-120 MIN COMPARISON:  Cervical CT myelogram 05/07/2018. FINDINGS: Intraoperative lateral spot films are presented. The patient has undergone cervical C3-C6 fusion with C5 corpectomy. There is incomplete visualization C6. Overall satisfactory position and alignment is suspected. IMPRESSION: C3-C6 fusion as described. Electronically Signed   By: Staci Righter M.D.   On: 09/17/2018 10:36   Dg C-arm 1-60 Min  Result Date: 09/17/2018 CLINICAL DATA:  Neck pain. EXAM: CERVICAL SPINE - 2-3 VIEW; DG C-ARM 61-120 MIN COMPARISON:  Cervical CT myelogram 05/07/2018. FINDINGS: Intraoperative lateral spot films are  presented. The patient has undergone cervical C3-C6 fusion with C5 corpectomy. There is incomplete visualization C6. Overall satisfactory position and alignment is suspected. IMPRESSION: C3-C6 fusion as described. Electronically Signed   By: Staci Righter M.D.   On: 09/17/2018 10:36    Assessment/Plan: improving  LOS: 1 day  Per Dr. Vertell Limber, d/c to home. Rx's for Norco and Robaxin for home use. Pt and wife verbalize understanding of d/c instructions and agree to call office to schedule 3-4wk f/u visit.   Verdis Prime 09/18/2018, 7:26 AM  Patient is doing well.  Discharge home.

## 2018-09-21 ENCOUNTER — Other Ambulatory Visit: Payer: Self-pay | Admitting: Pharmacist

## 2018-09-21 NOTE — Patient Outreach (Signed)
[facility]) Care Management  [place]   09/21/2018  Andre Mitchell 11/09/1945 [id number]  72 year old male outreached by [facility] services for a 30 day post discharge medication review.  PMHx includes, but not limited to, diabetes, HTN, gout. Patient presents for neck pain and bilateral arm pain with numbness.   Successful outreach to [name]. HIPPA identifiers confirmed with daughter. Both patient and daughter were on the phone for most of the conversation. Patient's daughter seems concerned for her father stating that he seems sad that the recovery process is not quick. Daughter states she has noticed some improvements in patient's ability to walk, but that his hand has not been recovering as fast. Daughter reviews medications that the patient has stopped taking including naproxen, glucosamine, aspirin, and fish oil. She also states that the patient has started newly taking robaxin, Norco, and colace. Daughter states patient does not check blood pressure at home. Also, she denies any problems with getting and taking medications and denies needing medication assistance.    Objective:  CrCl= 109 ml/min A1C= 6.3%  Current Medications: Current Outpatient Medications  Medication Sig Dispense Refill  . allopurinol (ZYLOPRIM) 300 MG tablet Take 150 mg by mouth daily.    Marland Kitchen atorvastatin (LIPITOR) 10 MG tablet Take 10 mg by mouth at bedtime.    . docusate sodium (COLACE) 100 MG capsule Take 300 mg by mouth daily.    Marland Kitchen gabapentin (NEURONTIN) 300 MG capsule Take 600 mg by mouth every evening.    Marland Kitchen HYDROcodone-acetaminophen (NORCO) 7.5-325 MG tablet Take 1-2 tablets by mouth every 4 (four) hours as needed for moderate pain or severe pain ((score 7 to 10)). 60 tablet 0  . metFORMIN (GLUCOPHAGE-XR) 500 MG 24 hr tablet Take 500 mg by mouth daily with breakfast.    . methocarbamol (ROBAXIN) 500 MG tablet Take 1 tablet (500 mg total) by mouth every 6 (six) hours as needed  for muscle spasms. 60 tablet 1  . Multiple Vitamin (MULTIVITAMIN WITH MINERALS) TABS tablet Take 1 tablet by mouth daily.    . Saw Palmetto 450 MG CAPS Take 900 mg by mouth 2 (two) times daily.    Marland Kitchen spironolactone (ALDACTONE) 25 MG tablet Take 25 mg by mouth daily.    Marland Kitchen aspirin EC 81 MG tablet Take 81 mg by mouth at bedtime.    . Omega-3 Fatty Acids (FISH OIL) 500 MG CAPS Take 500 mg by mouth 2 (two) times daily.     No current facility-administered medications for this visit.     Functional Status: In your present state of health, do you have any difficulty performing the following activities: 09/09/2018  Hearing? N  Vision? N  Difficulty concentrating or making decisions? N  Walking or climbing stairs? N  Dressing or bathing? N  Doing errands, shopping? N  Some recent data might be hidden    Fall/Depression Screening: No flowsheet data found. No flowsheet data found.   ASSESSMENT: Date Discharged from Hospital: 09/18/2018 Date Medication Reconciliation Performed: 09/21/2018   Medications Discontinued at Discharge:   Glucosamine  Naproxen   Aspirin (patient reported)  Fish oil (patient reported)   New Medications at Discharge:   [place]   Patient was recently discharged from hospital and all medications have been reviewed.  Drugs sorted by system:  Cardiovascular: Atorvastatin, spironolactone   Endocrine: Metformin  Gastrointestinal: Colace    Pain: Gabapentin, Norco, methocarbamol    Vitamins/Minerals/Supplements: Multivitamin, Saw palmetto  Miscellaneous: Allopurinol  Medication Review Findings:  . Use of Norco and methocarbamol together can increase the risk of respiratory and CNS depression.   PLAN: Will route note with recommendations to PCP Dr. Gilford Rile.  Gwenlyn Found, Sherian Rein D PGY1 Pharmacy Resident  Phone [phone] 09/21/2018   10:23 AM

## 2018-09-23 ENCOUNTER — Encounter (HOSPITAL_COMMUNITY): Payer: Self-pay | Admitting: Neurosurgery

## 2018-10-07 DIAGNOSIS — M5000 Cervical disc disorder with myelopathy, unspecified cervical region: Secondary | ICD-10-CM | POA: Diagnosis not present

## 2018-11-16 DIAGNOSIS — M4802 Spinal stenosis, cervical region: Secondary | ICD-10-CM | POA: Diagnosis not present

## 2018-11-20 DIAGNOSIS — M542 Cervicalgia: Secondary | ICD-10-CM | POA: Diagnosis not present

## 2018-11-23 DIAGNOSIS — M542 Cervicalgia: Secondary | ICD-10-CM | POA: Diagnosis not present

## 2018-11-25 DIAGNOSIS — M542 Cervicalgia: Secondary | ICD-10-CM | POA: Diagnosis not present

## 2018-11-30 DIAGNOSIS — M542 Cervicalgia: Secondary | ICD-10-CM | POA: Diagnosis not present

## 2018-12-02 DIAGNOSIS — M542 Cervicalgia: Secondary | ICD-10-CM | POA: Diagnosis not present

## 2018-12-07 DIAGNOSIS — M542 Cervicalgia: Secondary | ICD-10-CM | POA: Diagnosis not present

## 2018-12-09 DIAGNOSIS — M542 Cervicalgia: Secondary | ICD-10-CM | POA: Diagnosis not present

## 2018-12-15 DIAGNOSIS — M542 Cervicalgia: Secondary | ICD-10-CM | POA: Diagnosis not present

## 2018-12-22 DIAGNOSIS — M542 Cervicalgia: Secondary | ICD-10-CM | POA: Diagnosis not present

## 2018-12-25 DIAGNOSIS — R972 Elevated prostate specific antigen [PSA]: Secondary | ICD-10-CM | POA: Diagnosis not present

## 2018-12-25 DIAGNOSIS — N401 Enlarged prostate with lower urinary tract symptoms: Secondary | ICD-10-CM | POA: Diagnosis not present

## 2018-12-25 DIAGNOSIS — R339 Retention of urine, unspecified: Secondary | ICD-10-CM | POA: Diagnosis not present

## 2019-01-28 DIAGNOSIS — E782 Mixed hyperlipidemia: Secondary | ICD-10-CM | POA: Diagnosis not present

## 2019-01-28 DIAGNOSIS — E119 Type 2 diabetes mellitus without complications: Secondary | ICD-10-CM | POA: Diagnosis not present

## 2019-01-28 DIAGNOSIS — M150 Primary generalized (osteo)arthritis: Secondary | ICD-10-CM | POA: Diagnosis not present

## 2019-01-28 DIAGNOSIS — N401 Enlarged prostate with lower urinary tract symptoms: Secondary | ICD-10-CM | POA: Diagnosis not present

## 2019-01-28 DIAGNOSIS — R52 Pain, unspecified: Secondary | ICD-10-CM | POA: Diagnosis not present

## 2019-01-28 DIAGNOSIS — M5030 Other cervical disc degeneration, unspecified cervical region: Secondary | ICD-10-CM | POA: Diagnosis not present

## 2019-01-28 DIAGNOSIS — M9902 Segmental and somatic dysfunction of thoracic region: Secondary | ICD-10-CM | POA: Diagnosis not present

## 2019-01-28 DIAGNOSIS — G4733 Obstructive sleep apnea (adult) (pediatric): Secondary | ICD-10-CM | POA: Diagnosis not present

## 2019-01-28 DIAGNOSIS — Z0000 Encounter for general adult medical examination without abnormal findings: Secondary | ICD-10-CM | POA: Diagnosis not present

## 2019-01-28 DIAGNOSIS — M5136 Other intervertebral disc degeneration, lumbar region: Secondary | ICD-10-CM | POA: Diagnosis not present

## 2019-01-28 DIAGNOSIS — I10 Essential (primary) hypertension: Secondary | ICD-10-CM | POA: Diagnosis not present

## 2019-01-28 DIAGNOSIS — R972 Elevated prostate specific antigen [PSA]: Secondary | ICD-10-CM | POA: Diagnosis not present

## 2019-01-28 DIAGNOSIS — M9904 Segmental and somatic dysfunction of sacral region: Secondary | ICD-10-CM | POA: Diagnosis not present

## 2019-01-28 DIAGNOSIS — M109 Gout, unspecified: Secondary | ICD-10-CM | POA: Diagnosis not present

## 2019-01-28 DIAGNOSIS — Z79899 Other long term (current) drug therapy: Secondary | ICD-10-CM | POA: Diagnosis not present

## 2019-01-28 DIAGNOSIS — R351 Nocturia: Secondary | ICD-10-CM | POA: Diagnosis not present

## 2019-01-28 DIAGNOSIS — M9903 Segmental and somatic dysfunction of lumbar region: Secondary | ICD-10-CM | POA: Diagnosis not present

## 2019-01-28 DIAGNOSIS — M9901 Segmental and somatic dysfunction of cervical region: Secondary | ICD-10-CM | POA: Diagnosis not present

## 2019-03-08 DIAGNOSIS — L570 Actinic keratosis: Secondary | ICD-10-CM | POA: Diagnosis not present

## 2019-03-08 DIAGNOSIS — L821 Other seborrheic keratosis: Secondary | ICD-10-CM | POA: Diagnosis not present

## 2019-03-08 DIAGNOSIS — D225 Melanocytic nevi of trunk: Secondary | ICD-10-CM | POA: Diagnosis not present

## 2019-03-08 DIAGNOSIS — D2239 Melanocytic nevi of other parts of face: Secondary | ICD-10-CM | POA: Diagnosis not present

## 2019-04-09 DIAGNOSIS — H25813 Combined forms of age-related cataract, bilateral: Secondary | ICD-10-CM | POA: Diagnosis not present

## 2019-04-09 DIAGNOSIS — E119 Type 2 diabetes mellitus without complications: Secondary | ICD-10-CM | POA: Diagnosis not present

## 2019-06-09 DIAGNOSIS — M5000 Cervical disc disorder with myelopathy, unspecified cervical region: Secondary | ICD-10-CM | POA: Diagnosis not present

## 2019-06-09 DIAGNOSIS — M542 Cervicalgia: Secondary | ICD-10-CM | POA: Diagnosis not present

## 2019-06-09 DIAGNOSIS — I10 Essential (primary) hypertension: Secondary | ICD-10-CM | POA: Diagnosis not present

## 2019-06-09 DIAGNOSIS — M4802 Spinal stenosis, cervical region: Secondary | ICD-10-CM | POA: Diagnosis not present

## 2019-06-18 DIAGNOSIS — M4712 Other spondylosis with myelopathy, cervical region: Secondary | ICD-10-CM | POA: Diagnosis not present

## 2019-06-18 DIAGNOSIS — M542 Cervicalgia: Secondary | ICD-10-CM | POA: Diagnosis not present

## 2019-06-21 DIAGNOSIS — M542 Cervicalgia: Secondary | ICD-10-CM | POA: Diagnosis not present

## 2019-06-21 DIAGNOSIS — M4712 Other spondylosis with myelopathy, cervical region: Secondary | ICD-10-CM | POA: Diagnosis not present

## 2019-06-23 DIAGNOSIS — M4712 Other spondylosis with myelopathy, cervical region: Secondary | ICD-10-CM | POA: Diagnosis not present

## 2019-06-23 DIAGNOSIS — M542 Cervicalgia: Secondary | ICD-10-CM | POA: Diagnosis not present

## 2019-06-28 DIAGNOSIS — M4712 Other spondylosis with myelopathy, cervical region: Secondary | ICD-10-CM | POA: Diagnosis not present

## 2019-06-28 DIAGNOSIS — M542 Cervicalgia: Secondary | ICD-10-CM | POA: Diagnosis not present

## 2019-06-30 DIAGNOSIS — M542 Cervicalgia: Secondary | ICD-10-CM | POA: Diagnosis not present

## 2019-06-30 DIAGNOSIS — M4712 Other spondylosis with myelopathy, cervical region: Secondary | ICD-10-CM | POA: Diagnosis not present

## 2019-07-06 DIAGNOSIS — M542 Cervicalgia: Secondary | ICD-10-CM | POA: Diagnosis not present

## 2019-07-06 DIAGNOSIS — M4712 Other spondylosis with myelopathy, cervical region: Secondary | ICD-10-CM | POA: Diagnosis not present

## 2019-07-14 DIAGNOSIS — M4712 Other spondylosis with myelopathy, cervical region: Secondary | ICD-10-CM | POA: Diagnosis not present

## 2019-07-14 DIAGNOSIS — M542 Cervicalgia: Secondary | ICD-10-CM | POA: Diagnosis not present

## 2019-07-16 DIAGNOSIS — R339 Retention of urine, unspecified: Secondary | ICD-10-CM | POA: Diagnosis not present

## 2019-07-16 DIAGNOSIS — N401 Enlarged prostate with lower urinary tract symptoms: Secondary | ICD-10-CM | POA: Diagnosis not present

## 2019-07-16 DIAGNOSIS — R972 Elevated prostate specific antigen [PSA]: Secondary | ICD-10-CM | POA: Diagnosis not present

## 2019-07-26 DIAGNOSIS — M4712 Other spondylosis with myelopathy, cervical region: Secondary | ICD-10-CM | POA: Diagnosis not present

## 2019-07-26 DIAGNOSIS — M542 Cervicalgia: Secondary | ICD-10-CM | POA: Diagnosis not present

## 2019-07-30 DIAGNOSIS — M5412 Radiculopathy, cervical region: Secondary | ICD-10-CM | POA: Diagnosis not present

## 2019-07-30 DIAGNOSIS — E119 Type 2 diabetes mellitus without complications: Secondary | ICD-10-CM | POA: Diagnosis not present

## 2019-07-30 DIAGNOSIS — E782 Mixed hyperlipidemia: Secondary | ICD-10-CM | POA: Diagnosis not present

## 2019-07-30 DIAGNOSIS — R351 Nocturia: Secondary | ICD-10-CM | POA: Diagnosis not present

## 2019-07-30 DIAGNOSIS — I10 Essential (primary) hypertension: Secondary | ICD-10-CM | POA: Diagnosis not present

## 2019-07-30 DIAGNOSIS — Z23 Encounter for immunization: Secondary | ICD-10-CM | POA: Diagnosis not present

## 2019-07-30 DIAGNOSIS — N401 Enlarged prostate with lower urinary tract symptoms: Secondary | ICD-10-CM | POA: Diagnosis not present

## 2019-08-25 DIAGNOSIS — R748 Abnormal levels of other serum enzymes: Secondary | ICD-10-CM | POA: Diagnosis not present

## 2019-09-11 DIAGNOSIS — K760 Fatty (change of) liver, not elsewhere classified: Secondary | ICD-10-CM | POA: Diagnosis not present

## 2019-09-11 DIAGNOSIS — R748 Abnormal levels of other serum enzymes: Secondary | ICD-10-CM | POA: Diagnosis not present

## 2019-09-11 DIAGNOSIS — K8020 Calculus of gallbladder without cholecystitis without obstruction: Secondary | ICD-10-CM | POA: Diagnosis not present

## 2019-10-07 ENCOUNTER — Other Ambulatory Visit: Payer: Self-pay | Admitting: *Deleted

## 2019-10-07 NOTE — Patient Outreach (Signed)
[facility]) Care Management  10/07/2019  Andre Mitchell 12-28-1945 [id number]   Case reviewed, no patient outreach needed,  and case closed per Bary Castilla, Assistant Clinical Director at [facility]  request.   Colbert Coyer. Annia Friendly, BSN, [place] Management [facility] Telephonic CM Phone: [phone] Fax: [phone]

## 2019-10-15 DIAGNOSIS — R339 Retention of urine, unspecified: Secondary | ICD-10-CM | POA: Diagnosis not present

## 2019-10-15 DIAGNOSIS — N401 Enlarged prostate with lower urinary tract symptoms: Secondary | ICD-10-CM | POA: Diagnosis not present

## 2019-10-15 DIAGNOSIS — R972 Elevated prostate specific antigen [PSA]: Secondary | ICD-10-CM | POA: Diagnosis not present

## 2019-11-08 DIAGNOSIS — D2239 Melanocytic nevi of other parts of face: Secondary | ICD-10-CM | POA: Diagnosis not present

## 2019-11-08 DIAGNOSIS — L578 Other skin changes due to chronic exposure to nonionizing radiation: Secondary | ICD-10-CM | POA: Diagnosis not present

## 2019-11-08 DIAGNOSIS — L821 Other seborrheic keratosis: Secondary | ICD-10-CM | POA: Diagnosis not present

## 2019-11-08 DIAGNOSIS — D225 Melanocytic nevi of trunk: Secondary | ICD-10-CM | POA: Diagnosis not present

## 2019-12-08 DIAGNOSIS — G959 Disease of spinal cord, unspecified: Secondary | ICD-10-CM | POA: Diagnosis not present

## 2019-12-08 DIAGNOSIS — I10 Essential (primary) hypertension: Secondary | ICD-10-CM | POA: Diagnosis not present

## 2019-12-08 DIAGNOSIS — Z6830 Body mass index (BMI) 30.0-30.9, adult: Secondary | ICD-10-CM | POA: Diagnosis not present

## 2019-12-08 DIAGNOSIS — M4802 Spinal stenosis, cervical region: Secondary | ICD-10-CM | POA: Diagnosis not present

## 2020-02-08 DIAGNOSIS — M8949 Other hypertrophic osteoarthropathy, multiple sites: Secondary | ICD-10-CM | POA: Diagnosis not present

## 2020-02-08 DIAGNOSIS — E782 Mixed hyperlipidemia: Secondary | ICD-10-CM | POA: Diagnosis not present

## 2020-02-08 DIAGNOSIS — I10 Essential (primary) hypertension: Secondary | ICD-10-CM | POA: Diagnosis not present

## 2020-02-08 DIAGNOSIS — R972 Elevated prostate specific antigen [PSA]: Secondary | ICD-10-CM | POA: Diagnosis not present

## 2020-02-08 DIAGNOSIS — K8020 Calculus of gallbladder without cholecystitis without obstruction: Secondary | ICD-10-CM | POA: Diagnosis not present

## 2020-02-08 DIAGNOSIS — N401 Enlarged prostate with lower urinary tract symptoms: Secondary | ICD-10-CM | POA: Diagnosis not present

## 2020-02-08 DIAGNOSIS — K7581 Nonalcoholic steatohepatitis (NASH): Secondary | ICD-10-CM | POA: Diagnosis not present

## 2020-02-08 DIAGNOSIS — K635 Polyp of colon: Secondary | ICD-10-CM | POA: Diagnosis not present

## 2020-02-08 DIAGNOSIS — G629 Polyneuropathy, unspecified: Secondary | ICD-10-CM | POA: Diagnosis not present

## 2020-02-08 DIAGNOSIS — M109 Gout, unspecified: Secondary | ICD-10-CM | POA: Diagnosis not present

## 2020-02-08 DIAGNOSIS — Z0000 Encounter for general adult medical examination without abnormal findings: Secondary | ICD-10-CM | POA: Diagnosis not present

## 2020-02-08 DIAGNOSIS — E119 Type 2 diabetes mellitus without complications: Secondary | ICD-10-CM | POA: Diagnosis not present

## 2020-04-13 DIAGNOSIS — H2513 Age-related nuclear cataract, bilateral: Secondary | ICD-10-CM | POA: Diagnosis not present

## 2020-04-13 DIAGNOSIS — E119 Type 2 diabetes mellitus without complications: Secondary | ICD-10-CM | POA: Diagnosis not present

## 2020-04-14 DIAGNOSIS — N529 Male erectile dysfunction, unspecified: Secondary | ICD-10-CM | POA: Diagnosis not present

## 2020-04-14 DIAGNOSIS — N401 Enlarged prostate with lower urinary tract symptoms: Secondary | ICD-10-CM | POA: Diagnosis not present

## 2020-04-14 DIAGNOSIS — R972 Elevated prostate specific antigen [PSA]: Secondary | ICD-10-CM | POA: Diagnosis not present

## 2020-04-14 DIAGNOSIS — R339 Retention of urine, unspecified: Secondary | ICD-10-CM | POA: Diagnosis not present

## 2020-07-19 ENCOUNTER — Encounter: Payer: Self-pay | Admitting: Gastroenterology

## 2020-07-19 NOTE — Telephone Encounter (Signed)
Andre Mitchell this pt has never been seen here.  Are they asking to schedule colon.  If so, we need records if he has a history elsewhere.  I am not sure how I can help him since he has never been here.

## 2020-07-25 NOTE — Telephone Encounter (Signed)
Error

## 2020-08-09 DIAGNOSIS — K648 Other hemorrhoids: Secondary | ICD-10-CM | POA: Diagnosis not present

## 2020-08-09 DIAGNOSIS — K5730 Diverticulosis of large intestine without perforation or abscess without bleeding: Secondary | ICD-10-CM | POA: Diagnosis not present

## 2020-08-15 DIAGNOSIS — Z1159 Encounter for screening for other viral diseases: Secondary | ICD-10-CM | POA: Diagnosis not present

## 2020-08-15 DIAGNOSIS — Z20828 Contact with and (suspected) exposure to other viral communicable diseases: Secondary | ICD-10-CM | POA: Diagnosis not present

## 2020-08-22 DIAGNOSIS — K5730 Diverticulosis of large intestine without perforation or abscess without bleeding: Secondary | ICD-10-CM | POA: Diagnosis not present

## 2020-08-22 DIAGNOSIS — N400 Enlarged prostate without lower urinary tract symptoms: Secondary | ICD-10-CM | POA: Diagnosis not present

## 2020-08-22 DIAGNOSIS — I10 Essential (primary) hypertension: Secondary | ICD-10-CM | POA: Diagnosis not present

## 2020-08-22 DIAGNOSIS — K5790 Diverticulosis of intestine, part unspecified, without perforation or abscess without bleeding: Secondary | ICD-10-CM | POA: Diagnosis not present

## 2020-08-22 DIAGNOSIS — E119 Type 2 diabetes mellitus without complications: Secondary | ICD-10-CM | POA: Diagnosis not present

## 2020-08-22 DIAGNOSIS — Z79899 Other long term (current) drug therapy: Secondary | ICD-10-CM | POA: Diagnosis not present

## 2020-08-22 DIAGNOSIS — Z7982 Long term (current) use of aspirin: Secondary | ICD-10-CM | POA: Diagnosis not present

## 2020-08-22 DIAGNOSIS — Z86010 Personal history of colonic polyps: Secondary | ICD-10-CM | POA: Diagnosis not present

## 2020-08-22 DIAGNOSIS — Z09 Encounter for follow-up examination after completed treatment for conditions other than malignant neoplasm: Secondary | ICD-10-CM | POA: Diagnosis not present

## 2020-08-22 DIAGNOSIS — K648 Other hemorrhoids: Secondary | ICD-10-CM | POA: Diagnosis not present

## 2020-09-19 DIAGNOSIS — M8949 Other hypertrophic osteoarthropathy, multiple sites: Secondary | ICD-10-CM | POA: Diagnosis not present

## 2020-09-19 DIAGNOSIS — E119 Type 2 diabetes mellitus without complications: Secondary | ICD-10-CM | POA: Diagnosis not present

## 2020-09-19 DIAGNOSIS — E782 Mixed hyperlipidemia: Secondary | ICD-10-CM | POA: Diagnosis not present

## 2020-09-19 DIAGNOSIS — I10 Essential (primary) hypertension: Secondary | ICD-10-CM | POA: Diagnosis not present

## 2020-09-19 DIAGNOSIS — K7581 Nonalcoholic steatohepatitis (NASH): Secondary | ICD-10-CM | POA: Diagnosis not present

## 2020-09-29 IMAGING — RF DG C-ARM 61-120 MIN
1 series · 2 of 2 positions shown · non-contrast
Comparison: Cervical CT myelogram 05/07/2018.

CLINICAL DATA: Neck pain.

EXAM:
CERVICAL SPINE - 2-3 VIEW; DG C-ARM 61-120 MIN

[Series 1: run · 2 of 2 slices shown]
[im 1/2]
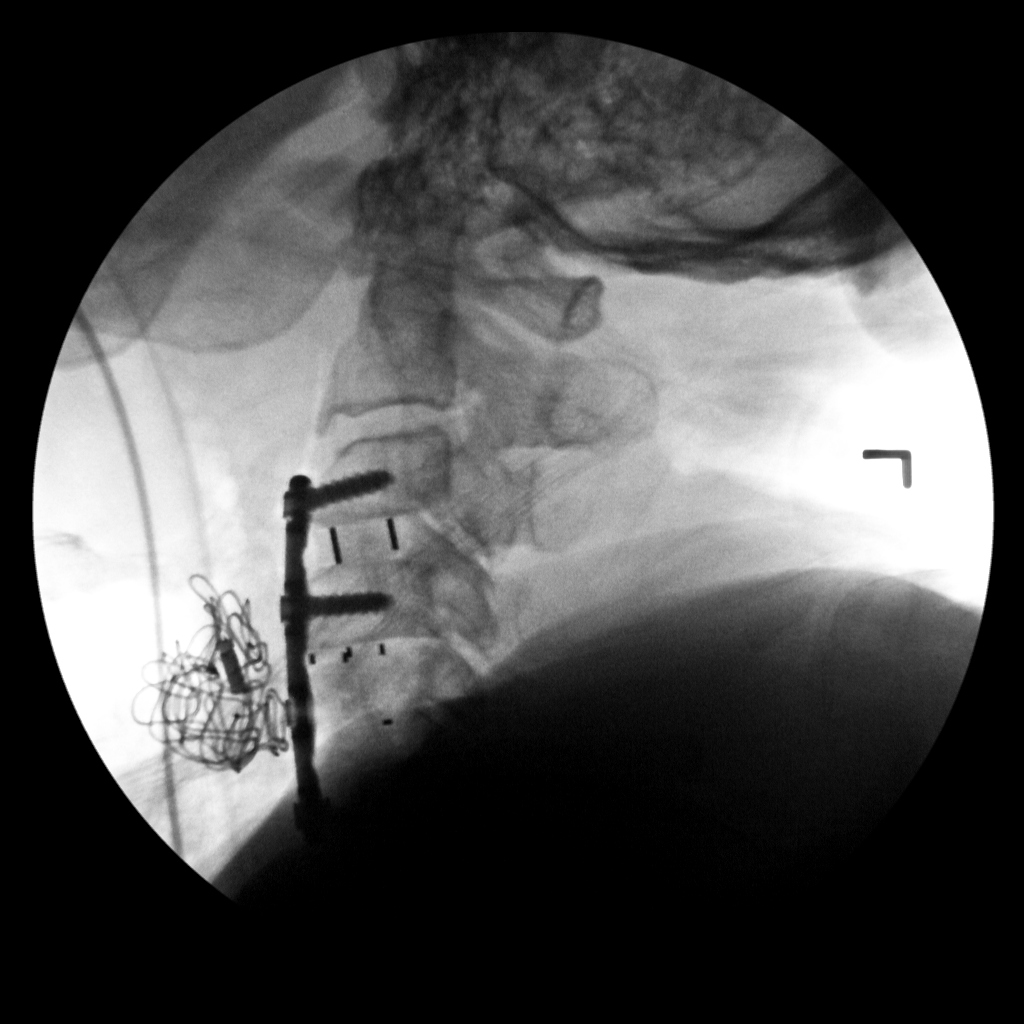
[im 2/2]
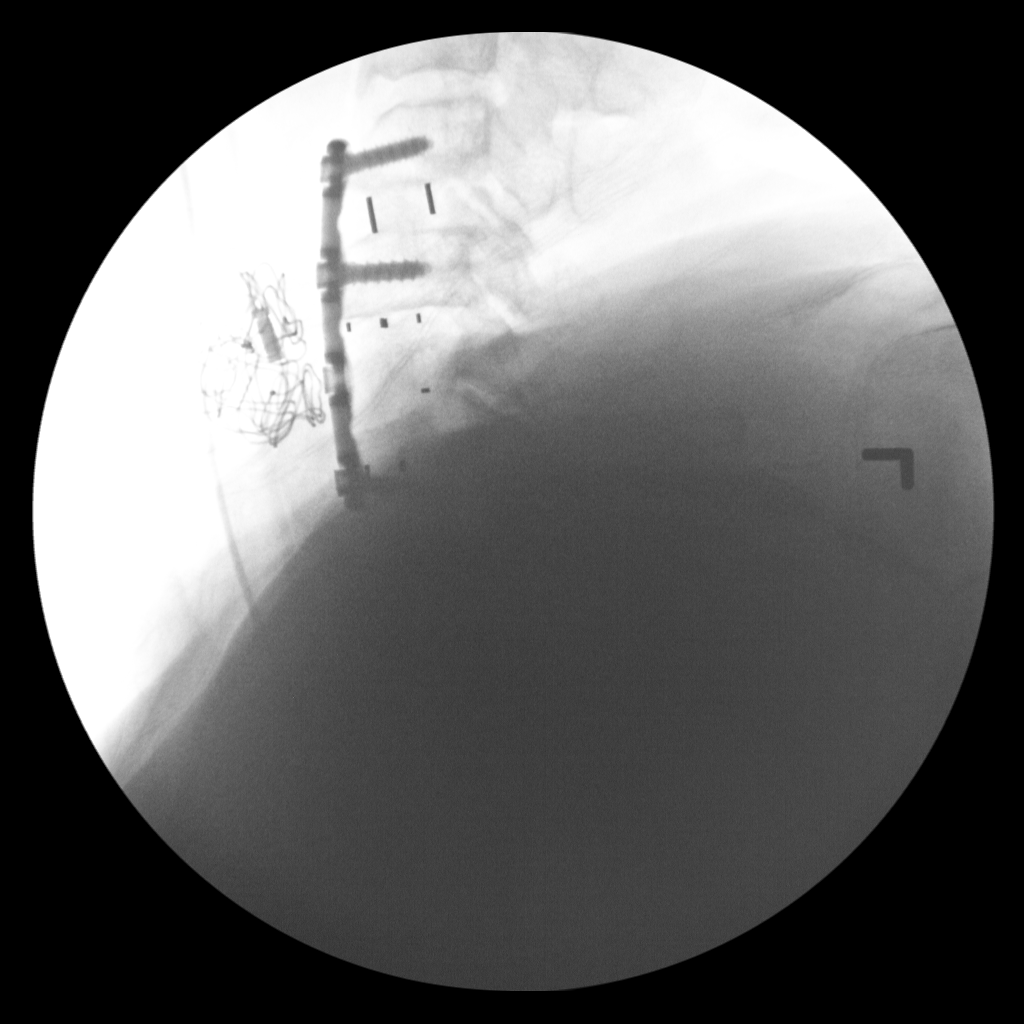

[2 of 2 positions shown; findings below may reference images not displayed]

FINDINGS: Intraoperative lateral spot films are presented. The patient has
undergone cervical C3-C6 fusion with C5 corpectomy. There is
incomplete visualization C6. Overall satisfactory position and
alignment is suspected.
IMPRESSION: C3-C6 fusion as described.

## 2020-10-17 DIAGNOSIS — R339 Retention of urine, unspecified: Secondary | ICD-10-CM | POA: Diagnosis not present

## 2020-10-17 DIAGNOSIS — N401 Enlarged prostate with lower urinary tract symptoms: Secondary | ICD-10-CM | POA: Diagnosis not present

## 2020-10-17 DIAGNOSIS — N529 Male erectile dysfunction, unspecified: Secondary | ICD-10-CM | POA: Diagnosis not present

## 2020-10-17 DIAGNOSIS — R972 Elevated prostate specific antigen [PSA]: Secondary | ICD-10-CM | POA: Diagnosis not present

## 2020-10-26 DIAGNOSIS — M5000 Cervical disc disorder with myelopathy, unspecified cervical region: Secondary | ICD-10-CM | POA: Diagnosis not present

## 2020-10-26 DIAGNOSIS — M542 Cervicalgia: Secondary | ICD-10-CM | POA: Diagnosis not present

## 2020-10-26 DIAGNOSIS — G959 Disease of spinal cord, unspecified: Secondary | ICD-10-CM | POA: Diagnosis not present

## 2020-10-26 DIAGNOSIS — M4802 Spinal stenosis, cervical region: Secondary | ICD-10-CM | POA: Diagnosis not present

## 2020-11-07 DIAGNOSIS — D225 Melanocytic nevi of trunk: Secondary | ICD-10-CM | POA: Diagnosis not present

## 2020-11-07 DIAGNOSIS — L570 Actinic keratosis: Secondary | ICD-10-CM | POA: Diagnosis not present

## 2020-11-07 DIAGNOSIS — D2239 Melanocytic nevi of other parts of face: Secondary | ICD-10-CM | POA: Diagnosis not present

## 2020-11-07 DIAGNOSIS — L821 Other seborrheic keratosis: Secondary | ICD-10-CM | POA: Diagnosis not present

## 2021-03-21 DIAGNOSIS — I10 Essential (primary) hypertension: Secondary | ICD-10-CM | POA: Diagnosis not present

## 2021-03-21 DIAGNOSIS — N401 Enlarged prostate with lower urinary tract symptoms: Secondary | ICD-10-CM | POA: Diagnosis not present

## 2021-03-21 DIAGNOSIS — K8020 Calculus of gallbladder without cholecystitis without obstruction: Secondary | ICD-10-CM | POA: Diagnosis not present

## 2021-03-21 DIAGNOSIS — R972 Elevated prostate specific antigen [PSA]: Secondary | ICD-10-CM | POA: Diagnosis not present

## 2021-03-21 DIAGNOSIS — G629 Polyneuropathy, unspecified: Secondary | ICD-10-CM | POA: Diagnosis not present

## 2021-03-21 DIAGNOSIS — R351 Nocturia: Secondary | ICD-10-CM | POA: Diagnosis not present

## 2021-03-21 DIAGNOSIS — K635 Polyp of colon: Secondary | ICD-10-CM | POA: Diagnosis not present

## 2021-03-21 DIAGNOSIS — M159 Polyosteoarthritis, unspecified: Secondary | ICD-10-CM | POA: Diagnosis not present

## 2021-03-21 DIAGNOSIS — E119 Type 2 diabetes mellitus without complications: Secondary | ICD-10-CM | POA: Diagnosis not present

## 2021-03-21 DIAGNOSIS — Z0000 Encounter for general adult medical examination without abnormal findings: Secondary | ICD-10-CM | POA: Diagnosis not present

## 2021-03-21 DIAGNOSIS — Z23 Encounter for immunization: Secondary | ICD-10-CM | POA: Diagnosis not present

## 2021-03-21 DIAGNOSIS — K7581 Nonalcoholic steatohepatitis (NASH): Secondary | ICD-10-CM | POA: Diagnosis not present

## 2021-03-21 DIAGNOSIS — E782 Mixed hyperlipidemia: Secondary | ICD-10-CM | POA: Diagnosis not present

## 2021-03-21 DIAGNOSIS — M109 Gout, unspecified: Secondary | ICD-10-CM | POA: Diagnosis not present

## 2021-04-18 DIAGNOSIS — E119 Type 2 diabetes mellitus without complications: Secondary | ICD-10-CM | POA: Diagnosis not present

## 2021-05-09 DIAGNOSIS — R972 Elevated prostate specific antigen [PSA]: Secondary | ICD-10-CM | POA: Diagnosis not present

## 2021-05-09 DIAGNOSIS — N529 Male erectile dysfunction, unspecified: Secondary | ICD-10-CM | POA: Diagnosis not present

## 2021-05-09 DIAGNOSIS — N401 Enlarged prostate with lower urinary tract symptoms: Secondary | ICD-10-CM | POA: Diagnosis not present

## 2021-05-09 DIAGNOSIS — R339 Retention of urine, unspecified: Secondary | ICD-10-CM | POA: Diagnosis not present

## 2021-07-10 DIAGNOSIS — D485 Neoplasm of uncertain behavior of skin: Secondary | ICD-10-CM | POA: Diagnosis not present

## 2021-07-10 DIAGNOSIS — D225 Melanocytic nevi of trunk: Secondary | ICD-10-CM | POA: Diagnosis not present

## 2021-07-10 DIAGNOSIS — D2239 Melanocytic nevi of other parts of face: Secondary | ICD-10-CM | POA: Diagnosis not present

## 2021-07-10 DIAGNOSIS — L821 Other seborrheic keratosis: Secondary | ICD-10-CM | POA: Diagnosis not present

## 2021-07-10 DIAGNOSIS — L570 Actinic keratosis: Secondary | ICD-10-CM | POA: Diagnosis not present

## 2021-08-13 DIAGNOSIS — C4441 Basal cell carcinoma of skin of scalp and neck: Secondary | ICD-10-CM | POA: Diagnosis not present

## 2021-09-26 DIAGNOSIS — E782 Mixed hyperlipidemia: Secondary | ICD-10-CM | POA: Diagnosis not present

## 2021-09-26 DIAGNOSIS — E1140 Type 2 diabetes mellitus with diabetic neuropathy, unspecified: Secondary | ICD-10-CM | POA: Diagnosis not present

## 2021-09-26 DIAGNOSIS — K7581 Nonalcoholic steatohepatitis (NASH): Secondary | ICD-10-CM | POA: Diagnosis not present

## 2021-09-26 DIAGNOSIS — I10 Essential (primary) hypertension: Secondary | ICD-10-CM | POA: Diagnosis not present

## 2024-01-09 ENCOUNTER — Other Ambulatory Visit: Payer: Self-pay | Admitting: Internal Medicine

## 2024-01-09 DIAGNOSIS — R972 Elevated prostate specific antigen [PSA]: Secondary | ICD-10-CM

## 2024-01-13 ENCOUNTER — Other Ambulatory Visit (HOSPITAL_COMMUNITY): Payer: Self-pay | Admitting: Internal Medicine

## 2024-01-13 DIAGNOSIS — R972 Elevated prostate specific antigen [PSA]: Secondary | ICD-10-CM

## 2024-01-14 ENCOUNTER — Other Ambulatory Visit (HOSPITAL_COMMUNITY): Payer: Self-pay | Admitting: Internal Medicine

## 2024-01-14 DIAGNOSIS — T1591XA Foreign body on external eye, part unspecified, right eye, initial encounter: Secondary | ICD-10-CM

## 2024-01-21 ENCOUNTER — Ambulatory Visit (HOSPITAL_COMMUNITY)
Admission: RE | Admit: 2024-01-21 | Discharge: 2024-01-21 | Disposition: A | Discharge status: Home / Self Care | Source: Ambulatory Visit | Attending: Internal Medicine | Admitting: Internal Medicine

## 2024-01-21 ENCOUNTER — Ambulatory Visit (HOSPITAL_COMMUNITY): Admission: RE | Admit: 2024-01-21 | Source: Ambulatory Visit

## 2024-01-21 ENCOUNTER — Encounter (HOSPITAL_COMMUNITY): Payer: Self-pay

## 2024-01-21 DIAGNOSIS — T1591XA Foreign body on external eye, part unspecified, right eye, initial encounter: Secondary | ICD-10-CM | POA: Diagnosis present
